# Patient Record
Sex: Female | Born: 1975 | Race: Black or African American | Hispanic: No | Marital: Married | Smoking: Never smoker
Health system: Southern US, Community
[De-identification: ages and names within clinical notes are randomized; demographics above are authoritative.]

## PROBLEM LIST (undated history)

## (undated) DIAGNOSIS — I10 Essential (primary) hypertension: Secondary | ICD-10-CM

## (undated) DIAGNOSIS — E119 Type 2 diabetes mellitus without complications: Secondary | ICD-10-CM

---

## 2018-06-01 ENCOUNTER — Other Ambulatory Visit: Payer: Self-pay | Admitting: Family Medicine

## 2018-06-01 DIAGNOSIS — Z1231 Encounter for screening mammogram for malignant neoplasm of breast: Secondary | ICD-10-CM

## 2018-06-23 ENCOUNTER — Ambulatory Visit
Admission: RE | Admit: 2018-06-23 | Discharge: 2018-06-23 | Disposition: A | Discharge status: Home / Self Care | Payer: Medicaid Other | Source: Ambulatory Visit | Attending: Family Medicine | Admitting: Family Medicine

## 2018-06-23 DIAGNOSIS — Z1231 Encounter for screening mammogram for malignant neoplasm of breast: Secondary | ICD-10-CM

## 2018-06-27 ENCOUNTER — Other Ambulatory Visit: Payer: Self-pay | Admitting: Family Medicine

## 2018-06-27 DIAGNOSIS — R928 Other abnormal and inconclusive findings on diagnostic imaging of breast: Secondary | ICD-10-CM

## 2018-07-01 ENCOUNTER — Ambulatory Visit
Admission: RE | Admit: 2018-07-01 | Discharge: 2018-07-01 | Disposition: A | Discharge status: Home / Self Care | Payer: Medicaid Other | Source: Ambulatory Visit | Attending: Family Medicine | Admitting: Family Medicine

## 2018-07-01 DIAGNOSIS — R928 Other abnormal and inconclusive findings on diagnostic imaging of breast: Secondary | ICD-10-CM

## 2019-09-23 ENCOUNTER — Other Ambulatory Visit: Payer: Self-pay

## 2019-09-23 DIAGNOSIS — Z20822 Contact with and (suspected) exposure to covid-19: Secondary | ICD-10-CM

## 2019-09-24 LAB — NOVEL CORONAVIRUS, NAA: SARS-CoV-2, NAA: NOT DETECTED

## 2020-02-27 ENCOUNTER — Emergency Department (HOSPITAL_COMMUNITY): Payer: Medicaid Other

## 2020-02-27 ENCOUNTER — Other Ambulatory Visit: Payer: Self-pay

## 2020-02-27 ENCOUNTER — Encounter (HOSPITAL_COMMUNITY): Payer: Self-pay | Admitting: Emergency Medicine

## 2020-02-27 ENCOUNTER — Emergency Department (HOSPITAL_COMMUNITY)
Admission: EM | Admit: 2020-02-27 | Discharge: 2020-02-27 | Disposition: A | Discharge status: Home / Self Care | Payer: Medicaid Other | Attending: Emergency Medicine | Admitting: Emergency Medicine

## 2020-02-27 DIAGNOSIS — R072 Precordial pain: Secondary | ICD-10-CM | POA: Diagnosis present

## 2020-02-27 DIAGNOSIS — E119 Type 2 diabetes mellitus without complications: Secondary | ICD-10-CM | POA: Insufficient documentation

## 2020-02-27 DIAGNOSIS — I10 Essential (primary) hypertension: Secondary | ICD-10-CM | POA: Insufficient documentation

## 2020-02-27 DIAGNOSIS — Z79899 Other long term (current) drug therapy: Secondary | ICD-10-CM | POA: Diagnosis not present

## 2020-02-27 DIAGNOSIS — R0789 Other chest pain: Secondary | ICD-10-CM | POA: Diagnosis not present

## 2020-02-27 HISTORY — DX: Essential (primary) hypertension: I10

## 2020-02-27 HISTORY — DX: Type 2 diabetes mellitus without complications: E11.9

## 2020-02-27 LAB — BASIC METABOLIC PANEL
Anion gap: 10 (ref 5–15)
BUN: 7 mg/dL (ref 6–20)
CO2: 22 mmol/L (ref 22–32)
Calcium: 9.1 mg/dL (ref 8.9–10.3)
Chloride: 104 mmol/L (ref 98–111)
Creatinine, Ser: 0.6 mg/dL (ref 0.44–1.00)
GFR calc Af Amer: >60 mL/min (ref >60)
GFR calc non Af Amer: >60 mL/min (ref >60)
Glucose, Bld: 218 mg/dL — ABNORMAL HIGH (ref 70–99)
Potassium: 4.1 mmol/L (ref 3.5–5.1)
Sodium: 136 mmol/L (ref 135–145)

## 2020-02-27 LAB — TROPONIN I (HIGH SENSITIVITY)
Troponin I (High Sensitivity): 24 ng/L — ABNORMAL HIGH (ref <18)
Troponin I (High Sensitivity): 22 ng/L — ABNORMAL HIGH (ref <18)

## 2020-02-27 LAB — PROTIME-INR
INR: 0.9 (ref 0.8–1.2)
Prothrombin Time: 12 seconds (ref 11.4–15.2)

## 2020-02-27 LAB — D-DIMER, QUANTITATIVE: D-Dimer, Quant: <0.27 ug/mL-FEU (ref 0.00–0.50)

## 2020-02-27 LAB — CBC
HCT: 36.5 % (ref 36.0–46.0)
Hemoglobin: 10.7 g/dL — ABNORMAL LOW (ref 12.0–15.0)
MCH: 22.3 pg — ABNORMAL LOW (ref 26.0–34.0)
MCHC: 29.3 g/dL — ABNORMAL LOW (ref 30.0–36.0)
MCV: 76.2 fL — ABNORMAL LOW (ref 80.0–100.0)
Platelets: 285 10*3/uL (ref 150–400)
RBC: 4.79 MIL/uL (ref 3.87–5.11)
RDW: 17.9 % — ABNORMAL HIGH (ref 11.5–15.5)
WBC: 6.1 10*3/uL (ref 4.0–10.5)
nRBC: 0 % (ref 0.0–0.2)

## 2020-02-27 LAB — I-STAT BETA HCG BLOOD, ED (MC, WL, AP ONLY): I-stat hCG, quantitative: <5 m[IU]/mL (ref <5)

## 2020-02-27 MED ORDER — OMEPRAZOLE 20 MG PO CPDR: 20.0000 mg | DELAYED_RELEASE_CAPSULE | Freq: Every day | ORAL | 14 refills | Status: AC

## 2020-02-27 MED ORDER — SODIUM CHLORIDE 0.9% FLUSH
3.0000 mL | Freq: Once | INTRAVENOUS | Status: AC
  Administered 2020-02-27: 3 mL via INTRAVENOUS

## 2020-02-27 MED ORDER — LIDOCAINE VISCOUS HCL 2 % MT SOLN
15.0000 mL | Freq: Once | OROMUCOSAL | Status: AC
  Administered 2020-02-27: 15 mL via ORAL
  Filled 2020-02-27: qty 15

## 2020-02-27 MED ORDER — ALUM & MAG HYDROXIDE-SIMETH 200-200-20 MG/5ML PO SUSP
30.0000 mL | Freq: Once | ORAL | Status: AC
  Administered 2020-02-27: 30 mL via ORAL
  Filled 2020-02-27: qty 30

## 2020-02-27 NOTE — ED Triage Notes (Signed)
Patient reports intermittent left upper chest pain onset this morning , no SOB , denies emesis or diaphoresis , no cough or fever .

## 2020-02-27 NOTE — ED Provider Notes (Signed)
[facility] EMERGENCY DEPARTMENT Provider Note   CSN: [id number] Arrival date & time: 02/27/20  0443     History Chief Complaint  Patient presents with  . Chest Pain    Connie Luna is a 44 y.o. female.  Patient is a 43 year old female with a history of hypertension, hyperlipidemia and diabetes who is presenting today with chest pain.  Patient reports last Wednesday she started noticing a discomfort in the center of her chest that she describes as pressure like something is stuck when she tries to swallow.  However it is unrelated to eating.  She has no difficulty swallowing her food and swallowing food does not make the discomfort worse.  It seems to come and go and is not related to exertion.  She did stop eating all spicy foods, drinking sodas and other harsh food and drink which she does not feel has made a difference.  She did use some Mylanta early on which seemed to help but now the symptoms seem to be coming more frequently.  This morning she woke up with a new sharp pinpoint type pain in the left side of her chest which is now resolved.  At no time has the pain radiated or has she felt short of breath, nauseated or diaphoretic.  She has noticed more frequent bowel movements this week but denies diarrhea.  She has not had any cough or congestion.  She did have Covid in January and recovered without sequela.  She has had no recent medication changes and denies any tobacco, alcohol or drug use.  No first-degree relatives with acute cardiac disease but maternal and paternal grandparents with heart disease.  No recent immobilization, unilateral leg pain or swelling or history of PE/DVT.  The history is provided by the patient.  Chest Pain Pain location:  Substernal area Pain quality: pressure   Pain radiates to:  Does not radiate Pain severity:  Mild Onset quality:  Gradual Duration:  7 days Timing:  Intermittent Progression:  Worsening Chronicity:  New Context  comment:  Seemed to start spontaneously Relieved by:  Antacids (initially improved with mylanta) Worsened by:  Nothing Ineffective treatments:  None tried Associated symptoms: no abdominal pain, no anorexia, no back pain, no cough, no diaphoresis, no fever, no lower extremity edema, no nausea, no near-syncope, no palpitations, no shortness of breath, no vomiting and no weakness   Risk factors: diabetes mellitus, high cholesterol, hypertension and obesity   Risk factors: no birth control, no immobilization, no prior DVT/PE, no smoking and no surgery        Past Medical History:  Diagnosis Date  . Diabetes mellitus without complication (HCC)   . Hypertension     There are no problems to display for this patient.   History reviewed. No pertinent surgical history.   OB History   No obstetric history on file.     Family History  Problem Relation Age of Onset  . Breast cancer Neg Hx     Social History   Tobacco Use  . Smoking status: Never Smoker  . Smokeless tobacco: Never Used  Substance Use Topics  . Alcohol use: Never  . Drug use: Never    Home Medications Prior to Admission medications   Not on File    Allergies    Patient has no known allergies.  Review of Systems   Review of Systems  Constitutional: Negative for diaphoresis and fever.  Respiratory: Negative for cough and shortness of breath.   Cardiovascular:  Positive for chest pain. Negative for palpitations and near-syncope.  Gastrointestinal: Negative for abdominal pain, anorexia, nausea and vomiting.  Musculoskeletal: Negative for back pain.  Neurological: Negative for weakness.  All other systems reviewed and are negative.   Physical Exam Updated Vital Signs BP 123/86   Pulse 85   Temp 98.2 F (36.8 C) (Oral)   Resp (!) 23   Ht 5\' 4"  (1.626 m)   Wt 120 kg   LMP 02/19/2020   SpO2 100%   BMI 45.41 kg/m   Physical Exam Vitals and nursing note reviewed.  Constitutional:      General: She  is not in acute distress.    Appearance: She is well-developed. She is obese.  HENT:     Head: Normocephalic and atraumatic.  Eyes:     Conjunctiva/sclera: Conjunctivae normal.     Pupils: Pupils are equal, round, and reactive to light.  Cardiovascular:     Rate and Rhythm: Normal rate and regular rhythm.     Heart sounds: Normal heart sounds. No murmur.  Pulmonary:     Effort: Pulmonary effort is normal. No respiratory distress.     Breath sounds: Normal breath sounds. No wheezing or rales.  Chest:     Chest wall: No tenderness.  Abdominal:     General: There is no distension.     Palpations: Abdomen is soft.     Tenderness: There is no abdominal tenderness. There is no guarding or rebound.  Musculoskeletal:        General: No tenderness. Normal range of motion.     Cervical back: Normal range of motion and neck supple.     Right lower leg: No tenderness. No edema.     Left lower leg: No tenderness. No edema.  Skin:    General: Skin is warm and dry.     Findings: No erythema or rash.  Neurological:     General: No focal deficit present.     Mental Status: She is alert and oriented to person, place, and time.  Psychiatric:        Mood and Affect: Mood normal.        Behavior: Behavior normal.     ED Results / Procedures / Treatments   Labs (all labs ordered are listed, but only abnormal results are displayed) Labs Reviewed  BASIC METABOLIC PANEL - Abnormal; Notable for the following components:      Result Value   Glucose, Bld 218 (*)    All other components within normal limits  CBC - Abnormal; Notable for the following components:   Hemoglobin 10.7 (*)    MCV 76.2 (*)    MCH 22.3 (*)    MCHC 29.3 (*)    RDW 17.9 (*)    All other components within normal limits  TROPONIN I (HIGH SENSITIVITY) - Abnormal; Notable for the following components:   Troponin I (High Sensitivity) 24 (*)    All other components within normal limits  TROPONIN I (HIGH SENSITIVITY) -  Abnormal; Notable for the following components:   Troponin I (High Sensitivity) 22 (*)    All other components within normal limits  PROTIME-INR  D-DIMER, QUANTITATIVE (NOT AT [facility])  I-STAT BETA HCG BLOOD, ED ([facility], [facility], [facility] ONLY)    EKG EKG Interpretation  Date/Time:  Tuesday February 27 2020 04:45:06 EDT Ventricular Rate:  108 PR Interval:  150 QRS Duration: 84 QT Interval:  328 QTC Calculation: 439 R Axis:   45 Text Interpretation: Sinus tachycardia Possible  Anterior infarct , age undetermined Abnormal ECG No old tracing to compare Confirmed by Merrily Pew [phone]) on 02/27/2020 5:08:01 AM   Radiology DG Chest 2 View  Result Date: 02/27/2020 CLINICAL DATA:  Chest pain EXAM: CHEST - 2 VIEW COMPARISON:  None. FINDINGS: Generous heart size. Normal aortic and hilar contours. There is no edema, consolidation, effusion, or pneumothorax. No acute osseous finding IMPRESSION: 1. Borderline heart size. 2. No pulmonary edema. Electronically Signed   By: Monte Fantasia M.D.   On: 02/27/2020 05:38    Procedures Procedures (including critical care time)  Medications Ordered in ED Medications  sodium chloride flush (NS) 0.9 % injection 3 mL (3 mLs Intravenous Given 02/27/20 0837)  alum & mag hydroxide-simeth (MAALOX/MYLANTA) 200-200-20 MG/5ML suspension 30 mL (30 mLs Oral Given 02/27/20 0837)    And  lidocaine (XYLOCAINE) 2 % viscous mouth solution 15 mL (15 mLs Oral Given 02/27/20 9935)    ED Course  I have reviewed the triage vital signs and the nursing notes.  Pertinent labs & imaging results that were available during my care of the patient were reviewed by me and considered in my medical decision making (see chart for details).    MDM Rules/Calculators/A&P                      44 year old female presenting today with atypical chest pain.  No infectious symptoms to suggest pneumonia, pericarditis or myocarditis.  Patient does not use excessive NSAIDs and does not consume significant  alcohol but does usually eat significant amounts of spicy foods which she has stopped but is still having symptoms however this could be GI related.  Initial improvement with Mylanta.  Patient also has multiple risk factors including hypertension, hyperlipidemia and diabetes this could be atypical ACS.  Patient's EKG showed sinus tachycardia but no other acute changes.  Troponin is mildly elevated at 24 and delta troponin is pending.  Low suspicion for dissection.  Given patient's tachycardia and atypical pain also PE is on the differential.  However she is a low risk Wells criteria and will do a D-dimer.  Patient's BMP and CBC without significant findings other than anemia which is most likely chronic from her heavy menses.  She has no history of lung disease and breath sounds are clear with low suspicion for pneumothorax, asthma exacerbation.  Heart pathway score of 2.  Pt given GI cocktail here.  10:57 AM D-dimer is within normal limits and delta troponin is 22 without significant change from initial troponin.  Given patient's risk factors and mildly elevated troponin which is flat spoke with cardiology who can get her in for close follow-up tomorrow at 3 PM.  Also will start omeprazole in case there is a GI component.  Findings discussed with patient and her husband and they are comfortable with this plan.  Patient was discharged home in good condition.  Final Clinical Impression(s) / ED Diagnoses Final diagnoses:  Atypical chest pain    Rx / DC Orders ED Discharge Orders         Ordered    omeprazole (PRILOSEC) 20 MG capsule  Daily     02/27/20 1052           Blanchie Dessert, MD 02/27/20 1058

## 2020-02-27 NOTE — Discharge Instructions (Addendum)
Start taking the omeprazole in case this is related to the stomach.  Continue to avoid caffeine, spicy foods and avoid ibuprofen products

## 2020-02-28 ENCOUNTER — Ambulatory Visit: Payer: Medicaid Other | Admitting: Cardiology

## 2020-02-28 ENCOUNTER — Encounter: Payer: Self-pay | Admitting: Cardiology

## 2020-02-28 VITALS — BP 130/90 | HR 116 | Ht 64.0 in | Wt 257.8 lb

## 2020-02-28 DIAGNOSIS — E785 Hyperlipidemia, unspecified: Secondary | ICD-10-CM

## 2020-02-28 DIAGNOSIS — R072 Precordial pain: Secondary | ICD-10-CM | POA: Diagnosis not present

## 2020-02-28 DIAGNOSIS — I10 Essential (primary) hypertension: Secondary | ICD-10-CM | POA: Diagnosis not present

## 2020-02-28 NOTE — Progress Notes (Signed)
Cardiology Office Note:    Date:  02/29/2020   ID:  Connie Luna, DOB 1976/01/03, MRN [id number]  PCP:  [name], MD  Cardiologist:  No primary care provider on file.  Electrophysiologist:  None   Referring MD: [name], MD   Chief Complaint  Patient presents with  . Chest Pain    History of Present Illness:    Connie Luna is a 44 y.o. female with a hx of hypertension, hyperlipidemia, diabetes who presents as a ED follow-up for chest pain.  Recent Covid infection in January.  Reports she woke up with chest pain around 3 AM on 3/30.  Described as sharp stabbing pain on left side of chest.  Lasted a few seconds and resolved, would occur repeatedly every few minutes for about 30 minutes.  She also describes a sensation that something is sitting in her chest, describes as feeling like food being stuck, it has been continuous for 1 week.  Denies any shortness of breath.  She typically walks for 20 minutes 2-3 times per week, denies any exertional chest pain or dyspnea.  She is on losartan for hypertension, but only takes about once per week.  No smoking history.  No history of heart disease in her immediate family.  Work-up in the ED included D-dimer negative, troponin 24 -> 22.    Past Medical History:  Diagnosis Date  . Diabetes mellitus without complication (HCC)   . Hypertension     No past surgical history on file.  Current Medications: Current Meds  Medication Sig  . atorvastatin (LIPITOR) 40 MG tablet Take 40 mg by mouth at bedtime.  . fluticasone (FLONASE) 50 MCG/ACT nasal spray Place 2 sprays into both nostrils in the morning and at bedtime.  . insulin glargine (LANTUS) 100 UNIT/ML Solostar Pen Inject 60 Units into the skin at bedtime.  Marland Kitchen loratadine (CLARITIN) 10 MG tablet Take 10 mg by mouth daily as needed for allergies.  Marland Kitchen losartan (COZAAR) 25 MG tablet Take 25 mg by mouth daily.  Marland Kitchen omeprazole (PRILOSEC) 20 MG capsule Take 1 capsule (20 mg total) by mouth  daily.  Marland Kitchen tetrahydrozoline-zinc (VISINE-AC) 0.05-0.25 % ophthalmic solution Place 2 drops into both eyes 2 (two) times daily as needed (Dry Eyes).     Allergies:   Patient has no known allergies.   Social History   Socioeconomic History  . Marital status: Married    Spouse name: Not on file  . Number of children: Not on file  . Years of education: Not on file  . Highest education level: Not on file  Occupational History  . Not on file  Tobacco Use  . Smoking status: Never Smoker  . Smokeless tobacco: Never Used  Substance and Sexual Activity  . Alcohol use: Never  . Drug use: Never  . Sexual activity: Never  Other Topics Concern  . Not on file  Social History Narrative  . Not on file   Social Determinants of Health   Financial Resource Strain:   . Difficulty of Paying Living Expenses:   Food Insecurity:   . Worried About Programme researcher, broadcasting/film/video in the Last Year:   . Barista in the Last Year:   Transportation Needs:   . Freight forwarder (Medical):   Marland Kitchen Lack of Transportation (Non-Medical):   Physical Activity:   . Days of Exercise per Week:   . Minutes of Exercise per Session:   Stress:   . Feeling of Stress :  Social Connections:   . Frequency of Communication with Friends and Family:   . Frequency of Social Gatherings with Friends and Family:   . Attends Religious Services:   . Active Member of Clubs or Organizations:   . Attends Banker Meetings:   Marland Kitchen Marital Status:      Family History: The patient's family history is negative for Breast cancer.  ROS:   Please see the history of present illness.     All other systems reviewed and are negative.  EKGs/Labs/Other Studies Reviewed:    The following studies were reviewed today:   EKG:  EKG is ordered today.  The ekg ordered demonstrates normal sinus rhythm, rate 99, no ST/T abnormalities  Recent Labs: 02/27/2020: BUN 7; Creatinine, Ser 0.60; Hemoglobin 10.7; Platelets 285;  Potassium 4.1; Sodium 136  Recent Lipid Panel No results found for: CHOL, TRIG, HDL, CHOLHDL, VLDL, LDLCALC, LDLDIRECT  Physical Exam:    VS:  BP 130/90   Pulse (!) 116   Ht 5\' 4"  (1.626 m)   Wt 257 lb 12.8 oz (116.9 kg)   LMP 02/19/2020   SpO2 98%   BMI 44.25 kg/m     Wt Readings from Last 3 Encounters:  02/28/20 257 lb 12.8 oz (116.9 kg)  02/27/20 264 lb 8.8 oz (120 kg)     GEN:  in no acute distress HEENT: Normal NECK: No JVD LYMPHATICS: No lymphadenopathy CARDIAC:RRR, no murmurs, rubs, gallops RESPIRATORY:  Clear to auscultation without rales, wheezing or rhonchi  ABDOMEN: Soft, non-tender, non-distended MUSCULOSKELETAL:  No edema; No deformity  SKIN: Warm and dry NEUROLOGIC:  Alert and oriented x 3 PSYCHIATRIC:  Normal affect   ASSESSMENT:    1. Precordial pain   2. Essential hypertension   3. Hyperlipidemia, unspecified hyperlipidemia type    PLAN:     Chest pain: Atypical in description, but does have significant CAD risk factors (diabetes, hypertension, hyperlipidemia).  Given tachycardia and morbid obesity, not a good candidate for coronary CTA.  Will evaluate for ischemia with Lexiscan Myoview  Hypertension: On losartan 25 mg daily.  BP is elevated but did not take today.  Reports only taking losartan about once per week.  Encourage compliance with taking losartan daily  Type 2 diabetes: On Lantus  Hyperlipidemia: Continue atorvastatin 40 mg daily  RTC in 6 weeks  Medication Adjustments/Labs and Tests Ordered: Current medicines are reviewed at length with the patient today.  Concerns regarding medicines are outlined above.  Orders Placed This Encounter  Procedures  . MYOCARDIAL PERFUSION IMAGING  . EKG 12-Lead   No orders of the defined types were placed in this encounter.   Patient Instructions  Medication Instructions:  CONTINUE WITH CURRENT MEDICATIONS. NO CHANGES.  *If you need a refill on your cardiac medications before your next  appointment, please call your pharmacy   Testing/Procedures: Your physician has requested that you have a lexiscan myoview. For further information please visit 02/29/20. Please follow instruction sheet, as given. [address]   Follow-Up: At [facility], you and your health needs are our priority.  As part of our continuing mission to provide you with exceptional heart care, we have created designated Provider Care Teams.  These Care Teams include your primary Cardiologist (physician) and Advanced Practice Providers (APPs -  Physician Assistants and Nurse Practitioners) who all work together to provide you with the care you need, when you need it.  We recommend signing up for the patient portal called "MyChart".  Sign  up information is provided on this After Visit Summary.  MyChart is used to connect with patients for Virtual Visits (Telemedicine).  Patients are able to view lab/test results, encounter notes, upcoming appointments, etc.  Non-urgent messages can be sent to your provider as well.   To learn more about what you can do with MyChart, go to [email or web address].    Your next appointment:   6 week(s)  The format for your next appointment:   In Person  Provider:   Oswaldo Milian, MD        Signed, Donato Heinz, MD  02/29/2020 6:30 PM    [place]

## 2020-02-28 NOTE — Patient Instructions (Signed)
Medication Instructions:  CONTINUE WITH CURRENT MEDICATIONS. NO CHANGES.  *If you need a refill on your cardiac medications before your next appointment, please call your pharmacy   Testing/Procedures: Your physician has requested that you have a lexiscan myoview. For further information please visit [email or web address] Please follow instruction sheet, as given. [address]   Follow-Up: At [facility], you and your health needs are our priority.  As part of our continuing mission to provide you with exceptional heart care, we have created designated Provider Care Teams.  These Care Teams include your primary Cardiologist (physician) and Advanced Practice Providers (APPs -  Physician Assistants and Nurse Practitioners) who all work together to provide you with the care you need, when you need it.  We recommend signing up for the patient portal called "MyChart".  Sign up information is provided on this After Visit Summary.  MyChart is used to connect with patients for Virtual Visits (Telemedicine).  Patients are able to view lab/test results, encounter notes, upcoming appointments, etc.  Non-urgent messages can be sent to your provider as well.   To learn more about what you can do with MyChart, go to [email or web address].    Your next appointment:   6 week(s)  The format for your next appointment:   In Person  Provider:   Epifanio Lesches, MD

## 2020-03-02 ENCOUNTER — Ambulatory Visit: Payer: Medicaid Other | Attending: Internal Medicine

## 2020-03-02 DIAGNOSIS — Z23 Encounter for immunization: Secondary | ICD-10-CM

## 2020-03-02 NOTE — Progress Notes (Signed)
   Covid-19 Vaccination Clinic  Name:  Connie Luna    MRN: [id number] DOB: 07-16-1976  03/02/2020  [name] was observed post Covid-19 immunization for 15 minutes without incident. She was provided with Vaccine Information Sheet and instruction to access the V-Safe system.   [name] was instructed to call 911 with any severe reactions post vaccine: Marland Kitchen Difficulty breathing  . Swelling of face and throat  . A fast heartbeat  . A bad rash all over body  . Dizziness and weakness   Immunizations Administered    Name Date Dose VIS Date Route   Moderna COVID-19 Vaccine 03/02/2020  2:07 PM 0.5 mL 10/31/2019 Intramuscular   Manufacturer: Moderna   Lot: [id number]   NDC: 50539-767-34

## 2020-03-13 ENCOUNTER — Telehealth (HOSPITAL_COMMUNITY): Payer: Self-pay | Admitting: *Deleted

## 2020-03-13 NOTE — Telephone Encounter (Signed)
Patient given detailed instructions per Myocardial Perfusion Study Information Sheet for the test on 03/18/20 Patient notified to arrive 15 minutes early and that it is imperative to arrive on time for appointment to keep from having the test rescheduled.  If you need to cancel or reschedule your appointment, please call the office within 24 hours of your appointment. . Patient verbalized understanding. Cosmo Tetreault Jacqueline    

## 2020-03-18 ENCOUNTER — Telehealth (HOSPITAL_COMMUNITY): Payer: Self-pay

## 2020-03-18 ENCOUNTER — Other Ambulatory Visit: Payer: Self-pay

## 2020-03-18 ENCOUNTER — Ambulatory Visit (HOSPITAL_COMMUNITY): Payer: Medicaid Other | Attending: Internal Medicine

## 2020-03-18 VITALS — Ht 64.0 in | Wt 257.0 lb

## 2020-03-18 DIAGNOSIS — R110 Nausea: Secondary | ICD-10-CM | POA: Diagnosis present

## 2020-03-18 DIAGNOSIS — R072 Precordial pain: Secondary | ICD-10-CM | POA: Diagnosis not present

## 2020-03-18 MED ORDER — TECHNETIUM TC 99M TETROFOSMIN IV KIT
33.0000 | PACK | Freq: Once | INTRAVENOUS | Status: AC | PRN
  Administered 2020-03-18: 33 via INTRAVENOUS
  Filled 2020-03-18: qty 33

## 2020-03-18 MED ORDER — REGADENOSON 0.4 MG/5ML IV SOLN
0.4000 mg | Freq: Once | INTRAVENOUS | Status: AC
  Administered 2020-03-18: 0.4 mg via INTRAVENOUS

## 2020-03-18 MED ORDER — AMINOPHYLLINE 25 MG/ML IV SOLN
75.0000 mg | Freq: Once | INTRAVENOUS | Status: AC
  Administered 2020-03-18: 75 mg via INTRAVENOUS

## 2020-03-19 ENCOUNTER — Ambulatory Visit (HOSPITAL_COMMUNITY): Payer: Medicaid Other | Attending: Cardiology

## 2020-03-19 LAB — MYOCARDIAL PERFUSION IMAGING
LV dias vol: 170 mL (ref 46–106)
LV sys vol: 117 mL
Peak HR: 117 {beats}/min
Rest HR: 95 {beats}/min
SDS: 0
SRS: 2
SSS: 2
TID: 1.09

## 2020-03-19 MED ORDER — TECHNETIUM TC 99M TETROFOSMIN IV KIT
29.6000 | PACK | Freq: Once | INTRAVENOUS | Status: AC | PRN
  Administered 2020-03-19: 29.6 via INTRAVENOUS
  Filled 2020-03-19: qty 30

## 2020-03-28 ENCOUNTER — Telehealth: Payer: Self-pay | Admitting: Cardiology

## 2020-03-28 DIAGNOSIS — I10 Essential (primary) hypertension: Secondary | ICD-10-CM

## 2020-03-28 DIAGNOSIS — R072 Precordial pain: Secondary | ICD-10-CM

## 2020-03-28 NOTE — Telephone Encounter (Signed)
Patient aware of results and recommendations.   Order placed for echo, message sent to scheduler to call to try to arrange prior to OV on 5/12

## 2020-03-28 NOTE — Telephone Encounter (Signed)
Follow Up:     Pt calling you back, concerning her results

## 2020-03-30 ENCOUNTER — Ambulatory Visit: Payer: Medicaid Other | Attending: Critical Care Medicine

## 2020-03-30 DIAGNOSIS — Z23 Encounter for immunization: Secondary | ICD-10-CM

## 2020-03-30 NOTE — Progress Notes (Signed)
   Covid-19 Vaccination Clinic  Name:  Connie Luna    MRN: [id number] DOB: 13-Feb-1976  03/30/2020  [name] was observed post Covid-19 immunization for 15 minutes without incident. She was provided with Vaccine Information Sheet and instruction to access the V-Safe system.   [name] was instructed to call 911 with any severe reactions post vaccine: Marland Kitchen Difficulty breathing  . Swelling of face and throat  . A fast heartbeat  . A bad rash all over body  . Dizziness and weakness   Immunizations Administered    Name Date Dose VIS Date Route   Moderna COVID-19 Vaccine 03/30/2020 12:02 PM 0.5 mL 10/2019 Intramuscular   Manufacturer: Moderna   Lot: [id number]   NDC: 42395-320-23

## 2020-04-02 ENCOUNTER — Other Ambulatory Visit: Payer: Self-pay

## 2020-04-02 ENCOUNTER — Ambulatory Visit (HOSPITAL_COMMUNITY)
Admission: RE | Admit: 2020-04-02 | Discharge: 2020-04-02 | Disposition: A | Discharge status: Home / Self Care | Payer: Medicaid Other | Source: Ambulatory Visit | Attending: Cardiology | Admitting: Cardiology

## 2020-04-02 DIAGNOSIS — I10 Essential (primary) hypertension: Secondary | ICD-10-CM | POA: Diagnosis not present

## 2020-04-02 DIAGNOSIS — E119 Type 2 diabetes mellitus without complications: Secondary | ICD-10-CM | POA: Insufficient documentation

## 2020-04-02 DIAGNOSIS — R072 Precordial pain: Secondary | ICD-10-CM | POA: Diagnosis not present

## 2020-04-02 NOTE — Progress Notes (Signed)
  Echocardiogram 2D Echocardiogram has been performed.  [name] 04/02/2020, 8:51 AM

## 2020-04-07 NOTE — Progress Notes (Deleted)
Cardiology Office Note:    Date:  04/07/2020   ID:  Connie Luna, DOB [birth year], MRN [id number]  PCP:  [name], MD  Cardiologist:  No primary care provider on file.  Electrophysiologist:  None   Referring MD: [name], MD   No chief complaint on file.   History of Present Illness:    Connie Luna is a 44 y.o. female with a hx of hypertension, hyperlipidemia, diabetes who presents for follow-up.  She was initially seen as a ED follow-up for chest pain on 02/28/2020.  Recent Covid infection in January.  Reports she woke up with chest pain around 3 AM on 3/30.  Described as sharp stabbing pain on left side of chest.  Lasted a few seconds and resolved, would occur repeatedly every few minutes for about 30 minutes.  She also describes a sensation that something is sitting in her chest, describes as feeling like food being stuck, it has been continuous for 1 week.  Denies any shortness of breath.  She typically walks for 20 minutes 2-3 times per week, denies any exertional chest pain or dyspnea.  She is on losartan for hypertension, but only takes about once per week.  No smoking history.  No history of heart disease in her immediate family.  Work-up in the ED included D-dimer negative, troponin 24 -> 22.  Lexiscan Myoview on 03/19/2020 showed small fixed defect at the apex, no evidence of ischemia.  EF 31%.  TTE on 04/02/2020 showed basal inferior/inferoseptal akinesis with LVEF 35 to 40%, grade 2 diastolic dysfunction, moderate LV dilatation, no significant valvular disease, IVC small/collapsible.    Past Medical History:  Diagnosis Date  . Diabetes mellitus without complication (HCC)   . Hypertension     No past surgical history on file.  Current Medications: No outpatient medications have been marked as taking for the 04/10/20 encounter (Appointment) with [name], MD.     Allergies:   Patient has no known allergies.   Social History   Socioeconomic  History  . Marital status: Married    Spouse name: Not on file  . Number of children: Not on file  . Years of education: Not on file  . Highest education level: Not on file  Occupational History  . Not on file  Tobacco Use  . Smoking status: Never Smoker  . Smokeless tobacco: Never Used  Substance and Sexual Activity  . Alcohol use: Never  . Drug use: Never  . Sexual activity: Never  Other Topics Concern  . Not on file  Social History Narrative  . Not on file   Social Determinants of Health   Financial Resource Strain:   . Difficulty of Paying Living Expenses:   Food Insecurity:   . Worried About Programme researcher, broadcasting/film/video in the Last Year:   . Barista in the Last Year:   Transportation Needs:   . Freight forwarder (Medical):   Marland Kitchen Lack of Transportation (Non-Medical):   Physical Activity:   . Days of Exercise per Week:   . Minutes of Exercise per Session:   Stress:   . Feeling of Stress :   Social Connections:   . Frequency of Communication with Friends and Family:   . Frequency of Social Gatherings with Friends and Family:   . Attends Religious Services:   . Active Member of Clubs or Organizations:   . Attends Banker Meetings:   Marland Kitchen Marital Status:      Family  History: The patient's family history is negative for Breast cancer.  ROS:   Please see the history of present illness.     All other systems reviewed and are negative.  EKGs/Labs/Other Studies Reviewed:    The following studies were reviewed today:   EKG:  EKG is ordered today.  The ekg ordered demonstrates normal sinus rhythm, rate 99, no ST/T abnormalities  Recent Labs: 02/27/2020: BUN 7; Creatinine, Ser 0.60; Hemoglobin 10.7; Platelets 285; Potassium 4.1; Sodium 136  Recent Lipid Panel No results found for: CHOL, TRIG, HDL, CHOLHDL, VLDL, LDLCALC, LDLDIRECT  Physical Exam:    VS:  There were no vitals taken for this visit.    Wt Readings from Last 3 Encounters:  03/18/20  257 lb (116.6 kg)  02/28/20 257 lb 12.8 oz (116.9 kg)  02/27/20 264 lb 8.8 oz (120 kg)     GEN:  in no acute distress HEENT: Normal NECK: No JVD LYMPHATICS: No lymphadenopathy CARDIAC:RRR, no murmurs, rubs, gallops RESPIRATORY:  Clear to auscultation without rales, wheezing or rhonchi  ABDOMEN: Soft, non-tender, non-distended MUSCULOSKELETAL:  No edema; No deformity  SKIN: Warm and dry NEUROLOGIC:  Alert and oriented x 3 PSYCHIATRIC:  Normal affect   ASSESSMENT:    No diagnosis found. PLAN:     Acute combined systolic and diastolic heart failure:  Lexiscan Myoview on 03/19/2020 showed small fixed defect at the apex, no evidence of ischemia.  EF 31%.  TTE on 04/02/2020 showed basal inferior/inferoseptal akinesis with LVEF 35 to 79%, grade 2 diastolic dysfunction, moderate LV dilatation, no significant valvular disease, IVC small/collapsible. -Continue losartan 25 mg daily.  Will transition to [facility] -Plan to add beta-blocker and Aldactone as tolerated -CMR for further evaluation  Chest pain: Atypical in description.  Lexiscan Myoview showed fixed apical defect, no ischemia.  Hypertension: On losartan 25 mg daily  Type 2 diabetes: On Lantus  Hyperlipidemia: Continue atorvastatin 40 mg daily  RTC in 6 weeks  Medication Adjustments/Labs and Tests Ordered: Current medicines are reviewed at length with the patient today.  Concerns regarding medicines are outlined above.  No orders of the defined types were placed in this encounter.  No orders of the defined types were placed in this encounter.   There are no Patient Instructions on file for this visit.   Signed, Donato Heinz, MD  04/07/2020 2:06 PM    [facility]

## 2020-04-10 ENCOUNTER — Ambulatory Visit: Payer: Medicaid Other | Admitting: Cardiology

## 2020-04-19 ENCOUNTER — Ambulatory Visit (INDEPENDENT_AMBULATORY_CARE_PROVIDER_SITE_OTHER): Payer: Medicaid Other | Admitting: Cardiology

## 2020-04-19 ENCOUNTER — Encounter: Payer: Self-pay | Admitting: Cardiology

## 2020-04-19 ENCOUNTER — Other Ambulatory Visit: Payer: Self-pay

## 2020-04-19 VITALS — BP 128/80 | HR 94 | Temp 97.0°F | Ht 64.0 in | Wt 212.0 lb

## 2020-04-19 DIAGNOSIS — Z13220 Encounter for screening for lipoid disorders: Secondary | ICD-10-CM

## 2020-04-19 DIAGNOSIS — Z01812 Encounter for preprocedural laboratory examination: Secondary | ICD-10-CM | POA: Diagnosis not present

## 2020-04-19 DIAGNOSIS — I10 Essential (primary) hypertension: Secondary | ICD-10-CM

## 2020-04-19 DIAGNOSIS — I5041 Acute combined systolic (congestive) and diastolic (congestive) heart failure: Secondary | ICD-10-CM

## 2020-04-19 DIAGNOSIS — E119 Type 2 diabetes mellitus without complications: Secondary | ICD-10-CM | POA: Diagnosis not present

## 2020-04-19 DIAGNOSIS — Z794 Long term (current) use of insulin: Secondary | ICD-10-CM

## 2020-04-19 MED ORDER — METOPROLOL SUCCINATE ER 25 MG PO TB24
25.0000 mg | ORAL_TABLET | Freq: Every day | ORAL | 3 refills | Status: DC
Start: 2020-04-19 — End: 2020-04-24

## 2020-04-19 NOTE — Patient Instructions (Addendum)
    [facility] CARDIOVASCULAR DIVISION [facility] [address] [place] [address] Dept: [phone] Loc: [phone]  Connie Luna  04/19/2020  You are scheduled for a Cardiac Catheterization on Wednesday, May 26 with Dr. Bryan Lemma.  1. Please arrive at the [facility] (Main Entrance A) at [facility]: [address] [place], [place] [address] at 9:30 AM (This time is two hours before your procedure to ensure your preparation). Free valet parking service is available.   Special note: Every effort is made to have your procedure done on time. Please understand that emergencies sometimes delay scheduled procedures.  2. Diet: Do not eat solid foods after midnight.  The patient may have clear liquids until 5am upon the day of the procedure.  3. Labs: today in office  COVID TEST: Monday 5/24 at 1:45 pm [address] [employer]  4. Medication instructions in preparation for your procedure:   Contrast Allergy: No  Hold Losartan AM of procedure  Take 1/2 of your usual dose of insulin the night before procedure, NO insulin AM of procedure   On the morning of your procedure, take your Aspirin and any morning medicines NOT listed above.  You may use sips of water.  5. Plan for one night stay--bring personal belongings. 6. Bring a current list of your medications and current insurance cards. 7. You MUST have a responsible person to drive you home. 8. Someone MUST be with you the first 24 hours after you arrive home or your discharge will be delayed. 9. Please wear clothes that are easy to get on and off and wear slip-on shoes.  Thank you for allowing [place] to care for you!   -- [facility] [place] [facility]    START metoprolol succinate (Toprol XL) 25 mg daily   Follow up with Dr. Bjorn Pippin in 2 weeks

## 2020-04-19 NOTE — Progress Notes (Signed)
Cardiology Office Note:    Date:  04/21/2020   ID:  Connie Luna, DOB February 13, 1976, MRN [id number]  PCP:  [name], MD  Cardiologist:  No primary care provider on file.  Electrophysiologist:  None   Referring MD: [name], MD   Chief Complaint  Patient presents with  . Chest Pain    History of Present Illness:    Connie Luna is a 44 y.o. female with a hx of hypertension, hyperlipidemia, diabetes who presents for follow-up.  She was initially seen as a ED follow-up for chest pain on 02/28/2020.  Recent Covid infection in January.  Reports she woke up with chest pain around 3 AM on 3/30.  Described as sharp stabbing pain on left side of chest.  Lasted a few seconds and resolved, would occur repeatedly every few minutes for about 30 minutes.  She also describes a sensation that something is sitting in her chest, describes as feeling like food being stuck, it has been continuous for 1 week.  Denies any shortness of breath.  She typically walks for 20 minutes 2-3 times per week, denies any exertional chest pain or dyspnea.  She has been on losartan for hypertension, but was only taking about once per week.  No smoking history.  No history of heart disease in her immediate family.  Work-up in the ED included D-dimer negative, troponin 24 -> 22.  Lexiscan Myoview on 03/19/2020 showed small fixed defect at the apex, no evidence of ischemia (though no poor quality study with low cardiac counts compared to extracardiac activity)..  EF 31%.  TTE on 04/02/2020 showed basal inferior/inferoseptal akinesis with LVEF 35 to 40%, grade 2 diastolic dysfunction, moderate LV dilatation, no significant valvular disease, IVC small/collapsible.  Since last clinic visit, she reports that she continues to have chest pain, occurring a couple times per week.  Typically last 30 to 45 minutes.  States that it feels like something is stuck in her chest.  She has been walking 20 minutes every 1 to 2 days.  Has not  noted relationship with exertion.  She denies any shortness of breath, lightheadedness, or syncope.  Does report having palpitations occurring about once every 2 weeks that lasts for less than a minute.  Feels like heart is racing.   Past Medical History:  Diagnosis Date  . Diabetes mellitus without complication (HCC)   . Hypertension     No past surgical history on file.  Current Medications: Current Meds  Medication Sig  . atorvastatin (LIPITOR) 40 MG tablet Take 40 mg by mouth at bedtime.  . fluticasone (FLONASE) 50 MCG/ACT nasal spray Place 2 sprays into both nostrils in the morning and at bedtime.  . insulin glargine (LANTUS) 100 UNIT/ML Solostar Pen Inject 60 Units into the skin at bedtime.  Marland Kitchen loratadine (CLARITIN) 10 MG tablet Take 10 mg by mouth daily as needed for allergies.  Marland Kitchen losartan (COZAAR) 25 MG tablet Take 25 mg by mouth daily.  Marland Kitchen omeprazole (PRILOSEC) 20 MG capsule Take 1 capsule (20 mg total) by mouth daily.  Marland Kitchen tetrahydrozoline-zinc (VISINE-AC) 0.05-0.25 % ophthalmic solution Place 2 drops into both eyes 2 (two) times daily as needed (Dry Eyes).     Allergies:   Patient has no known allergies.   Social History   Socioeconomic History  . Marital status: Married    Spouse name: Not on file  . Number of children: Not on file  . Years of education: Not on file  . Highest education level:  Not on file  Occupational History  . Not on file  Tobacco Use  . Smoking status: Never Smoker  . Smokeless tobacco: Never Used  Substance and Sexual Activity  . Alcohol use: Never  . Drug use: Never  . Sexual activity: Never  Other Topics Concern  . Not on file  Social History Narrative  . Not on file   Social Determinants of Health   Financial Resource Strain:   . Difficulty of Paying Living Expenses:   Food Insecurity:   . Worried About Running Out of Food in the Last Year:   . Ran Out of Food in the Last Year:   Transportation Needs:   . Lack of Transportation  (Medical):   . Lack of Transportation (Non-Medical):   Physical Activity:   . Days of Exercise per Week:   . Minutes of Exercise per Session:   Stress:   . Feeling of Stress :   Social Connections:   . Frequency of Communication with Friends and Family:   . Frequency of Social Gatherings with Friends and Family:   . Attends Religious Services:   . Active Member of Clubs or Organizations:   . Attends Club or Organization Meetings:   . Marital Status:      Family History: The patient's family history is negative for Breast cancer.  ROS:   Please see the history of present illness.     All other systems reviewed and are negative.  EKGs/Labs/Other Studies Reviewed:    The following studies were reviewed today:   EKG:  EKG is ordered today.  The ekg ordered demonstrates normal sinus rhythm, rate 94, poor R wave progression, no ST abnormalities  Recent Labs: 04/19/2020: BUN 8; Creatinine, Ser 0.75; Hemoglobin 10.4; Platelets 245; Potassium 4.2; Sodium 136; TSH 1.790  Recent Lipid Panel    Component Value Date/Time   CHOL 164 04/19/2020 1049   TRIG 76 04/19/2020 1049   HDL 59 04/19/2020 1049   CHOLHDL 2.8 04/19/2020 1049   LDLCALC 91 04/19/2020 1049    Physical Exam:    VS:  BP 128/80   Pulse 94   Temp (!) 97 F (36.1 C)   Ht 5' 4" (1.626 m)   Wt 212 lb (96.2 kg)   SpO2 97%   BMI 36.39 kg/m     Wt Readings from Last 3 Encounters:  04/19/20 212 lb (96.2 kg)  03/18/20 257 lb (116.6 kg)  02/28/20 257 lb 12.8 oz (116.9 kg)     GEN:  in no acute distress HEENT: Normal NECK: No JVD LYMPHATICS: No lymphadenopathy CARDIAC:RRR, no murmurs, rubs, gallops RESPIRATORY:  Clear to auscultation without rales, wheezing or rhonchi  ABDOMEN: Soft, non-tender, non-distended MUSCULOSKELETAL:  No edema; No deformity  SKIN: Warm and dry NEUROLOGIC:  Alert and oriented x 3 PSYCHIATRIC:  Normal affect   ASSESSMENT:    1. Acute combined systolic and diastolic heart failure  (HCC)   2. Pre-procedure lab exam   3. Type 2 diabetes mellitus without complication, with long-term current use of insulin (HCC)   4. Lipid screening   5. Essential hypertension    PLAN:     Acute combined systolic and diastolic heart failure:  Lexiscan Myoview on 03/19/2020 showed small fixed defect at the apex, no evidence of ischemia but was poor quality study with low cardiac counts compared to extracardiac activity.  EF 31%.  TTE on 04/02/2020 showed basal inferior/inferoseptal akinesis with LVEF 35 to 40%, grade 2 diastolic dysfunction, moderate LV dilatation,   no significant valvular disease, IVC small/collapsible -Continue losartan 25 mg daily -Will start toprol XL 25 mg daily -Given poor quality Lexiscan Myoview and considering patient continues to have atypical chest pain with significant CAD risk factors (type 2 diabetes, hypertension, hyperlipidemia) and segmental wall motion abnormality on echo, would favor definitive evaluation for CAD with catheterization.  Will order LHC/RHC. Risks and benefits of cardiac catheterization have been discussed with the patient.  These include bleeding, infection, kidney damage, stroke, heart attack, death.  The patient understands these risks and is willing to proceed. -Volume status difficult to assess, but does not appear hypervolemic and IVC was small/collapsible on recent TTE.  Will hold off on starting Lasix at this time and follow-up results of RHC  Hypertension: On losartan 25 mg daily.  Add toprol XL 25 mg daily as above  Type 2 diabetes: On Lantus.  Will check A1c.    Hyperlipidemia: Continue atorvastatin 40 mg daily.  Will check lipid panel.  Anemia: Hemoglobin 10.7 on 02/27/2020.  Will check CBC, iron studies  RTC in 2 weeks  Medication Adjustments/Labs and Tests Ordered: Current medicines are reviewed at length with the patient today.  Concerns regarding medicines are outlined above.  Orders Placed This Encounter  Procedures  .  Fe+TIBC+Fer  . Basic metabolic panel  . CBC  . Lipid panel  . TSH  . Hemoglobin A1c  . EKG 12-Lead   Meds ordered this encounter  Medications  . metoprolol succinate (TOPROL XL) 25 MG 24 hr tablet    Sig: Take 1 tablet (25 mg total) by mouth daily.    Dispense:  90 tablet    Refill:  3    Patient Instructions     [facility] CARDIOVASCULAR DIVISION [facility] [address] [place] [address] Dept: [phone] Loc: [phone]  Connie Luna  04/19/2020  You are scheduled for a Cardiac Catheterization on Wednesday, May 26 with Dr. Bryan Lemma.  1. Please arrive at the [facility] (Main Entrance A) at [facility]: [address] [place], [place] [address] at 9:30 AM (This time is two hours before your procedure to ensure your preparation). Free valet parking service is available.   Special note: Every effort is made to have your procedure done on time. Please understand that emergencies sometimes delay scheduled procedures.  2. Diet: Do not eat solid foods after midnight.  The patient may have clear liquids until 5am upon the day of the procedure.  3. Labs: today in office  COVID TEST: Monday 5/24 at 1:45 pm [address] [employer]  4. Medication instructions in preparation for your procedure:   Contrast Allergy: No  Hold Losartan AM of procedure  Take 1/2 of your usual dose of insulin the night before procedure, NO insulin AM of procedure   On the morning of your procedure, take your Aspirin and any morning medicines NOT listed above.  You may use sips of water.  5. Plan for one night stay--bring personal belongings. 6. Bring a current list of your medications and current insurance cards. 7. You MUST have a responsible person to drive you home. 8. Someone MUST be with you the first 24 hours after you arrive home or your discharge will be delayed. 9. Please wear clothes that are easy to get on and  off and wear slip-on shoes.  Thank you for allowing [place] to care for you!   -- [place] [facility]  START metoprolol succinate (Toprol XL) 25 mg daily   Follow up with Dr. Gardiner Rhyme in 2 weeks    Signed, Donato Heinz, MD  04/21/2020 4:16 PM    [place]

## 2020-04-19 NOTE — H&P (View-Only) (Signed)
Cardiology Office Note:    Date:  04/21/2020   ID:  Connie Luna, DOB February 13, 1976, MRN [id number]  PCP:  [name], MD  Cardiologist:  No primary care provider on file.  Electrophysiologist:  None   Referring MD: [name], MD   Chief Complaint  Patient presents with  . Chest Pain    History of Present Illness:    Connie Luna is a 44 y.o. female with a hx of hypertension, hyperlipidemia, diabetes who presents for follow-up.  She was initially seen as a ED follow-up for chest pain on 02/28/2020.  Recent Covid infection in January.  Reports she woke up with chest pain around 3 AM on 3/30.  Described as sharp stabbing pain on left side of chest.  Lasted a few seconds and resolved, would occur repeatedly every few minutes for about 30 minutes.  She also describes a sensation that something is sitting in her chest, describes as feeling like food being stuck, it has been continuous for 1 week.  Denies any shortness of breath.  She typically walks for 20 minutes 2-3 times per week, denies any exertional chest pain or dyspnea.  She has been on losartan for hypertension, but was only taking about once per week.  No smoking history.  No history of heart disease in her immediate family.  Work-up in the ED included D-dimer negative, troponin 24 -> 22.  Lexiscan Myoview on 03/19/2020 showed small fixed defect at the apex, no evidence of ischemia (though no poor quality study with low cardiac counts compared to extracardiac activity)..  EF 31%.  TTE on 04/02/2020 showed basal inferior/inferoseptal akinesis with LVEF 35 to 40%, grade 2 diastolic dysfunction, moderate LV dilatation, no significant valvular disease, IVC small/collapsible.  Since last clinic visit, she reports that she continues to have chest pain, occurring a couple times per week.  Typically last 30 to 45 minutes.  States that it feels like something is stuck in her chest.  She has been walking 20 minutes every 1 to 2 days.  Has not  noted relationship with exertion.  She denies any shortness of breath, lightheadedness, or syncope.  Does report having palpitations occurring about once every 2 weeks that lasts for less than a minute.  Feels like heart is racing.   Past Medical History:  Diagnosis Date  . Diabetes mellitus without complication (HCC)   . Hypertension     No past surgical history on file.  Current Medications: Current Meds  Medication Sig  . atorvastatin (LIPITOR) 40 MG tablet Take 40 mg by mouth at bedtime.  . fluticasone (FLONASE) 50 MCG/ACT nasal spray Place 2 sprays into both nostrils in the morning and at bedtime.  . insulin glargine (LANTUS) 100 UNIT/ML Solostar Pen Inject 60 Units into the skin at bedtime.  Marland Kitchen loratadine (CLARITIN) 10 MG tablet Take 10 mg by mouth daily as needed for allergies.  Marland Kitchen losartan (COZAAR) 25 MG tablet Take 25 mg by mouth daily.  Marland Kitchen omeprazole (PRILOSEC) 20 MG capsule Take 1 capsule (20 mg total) by mouth daily.  Marland Kitchen tetrahydrozoline-zinc (VISINE-AC) 0.05-0.25 % ophthalmic solution Place 2 drops into both eyes 2 (two) times daily as needed (Dry Eyes).     Allergies:   Patient has no known allergies.   Social History   Socioeconomic History  . Marital status: Married    Spouse name: Not on file  . Number of children: Not on file  . Years of education: Not on file  . Highest education level:  Not on file  Occupational History  . Not on file  Tobacco Use  . Smoking status: Never Smoker  . Smokeless tobacco: Never Used  Substance and Sexual Activity  . Alcohol use: Never  . Drug use: Never  . Sexual activity: Never  Other Topics Concern  . Not on file  Social History Narrative  . Not on file   Social Determinants of Health   Financial Resource Strain:   . Difficulty of Paying Living Expenses:   Food Insecurity:   . Worried About Programme researcher, broadcasting/film/video in the Last Year:   . Barista in the Last Year:   Transportation Needs:   . Freight forwarder  (Medical):   Marland Kitchen Lack of Transportation (Non-Medical):   Physical Activity:   . Days of Exercise per Week:   . Minutes of Exercise per Session:   Stress:   . Feeling of Stress :   Social Connections:   . Frequency of Communication with Friends and Family:   . Frequency of Social Gatherings with Friends and Family:   . Attends Religious Services:   . Active Member of Clubs or Organizations:   . Attends Banker Meetings:   Marland Kitchen Marital Status:      Family History: The patient's family history is negative for Breast cancer.  ROS:   Please see the history of present illness.     All other systems reviewed and are negative.  EKGs/Labs/Other Studies Reviewed:    The following studies were reviewed today:   EKG:  EKG is ordered today.  The ekg ordered demonstrates normal sinus rhythm, rate 94, poor R wave progression, no ST abnormalities  Recent Labs: 04/19/2020: BUN 8; Creatinine, Ser 0.75; Hemoglobin 10.4; Platelets 245; Potassium 4.2; Sodium 136; TSH 1.790  Recent Lipid Panel    Component Value Date/Time   CHOL 164 04/19/2020 1049   TRIG 76 04/19/2020 1049   HDL 59 04/19/2020 1049   CHOLHDL 2.8 04/19/2020 1049   LDLCALC 91 04/19/2020 1049    Physical Exam:    VS:  BP 128/80   Pulse 94   Temp (!) 97 F (36.1 C)   Ht 5\' 4"  (1.626 m)   Wt 212 lb (96.2 kg)   SpO2 97%   BMI 36.39 kg/m     Wt Readings from Last 3 Encounters:  04/19/20 212 lb (96.2 kg)  03/18/20 257 lb (116.6 kg)  02/28/20 257 lb 12.8 oz (116.9 kg)     GEN:  in no acute distress HEENT: Normal NECK: No JVD LYMPHATICS: No lymphadenopathy CARDIAC:RRR, no murmurs, rubs, gallops RESPIRATORY:  Clear to auscultation without rales, wheezing or rhonchi  ABDOMEN: Soft, non-tender, non-distended MUSCULOSKELETAL:  No edema; No deformity  SKIN: Warm and dry NEUROLOGIC:  Alert and oriented x 3 PSYCHIATRIC:  Normal affect   ASSESSMENT:    1. Acute combined systolic and diastolic heart failure  (HCC)   2. Pre-procedure lab exam   3. Type 2 diabetes mellitus without complication, with long-term current use of insulin (HCC)   4. Lipid screening   5. Essential hypertension    PLAN:     Acute combined systolic and diastolic heart failure:  Lexiscan Myoview on 03/19/2020 showed small fixed defect at the apex, no evidence of ischemia but was poor quality study with low cardiac counts compared to extracardiac activity.  EF 31%.  TTE on 04/02/2020 showed basal inferior/inferoseptal akinesis with LVEF 35 to 40%, grade 2 diastolic dysfunction, moderate LV dilatation,  no significant valvular disease, IVC small/collapsible -Continue losartan 25 mg daily -Will start toprol XL 25 mg daily -Given poor quality Lexiscan Myoview and considering patient continues to have atypical chest pain with significant CAD risk factors (type 2 diabetes, hypertension, hyperlipidemia) and segmental wall motion abnormality on echo, would favor definitive evaluation for CAD with catheterization.  Will order LHC/RHC. Risks and benefits of cardiac catheterization have been discussed with the patient.  These include bleeding, infection, kidney damage, stroke, heart attack, death.  The patient understands these risks and is willing to proceed. -Volume status difficult to assess, but does not appear hypervolemic and IVC was small/collapsible on recent TTE.  Will hold off on starting Lasix at this time and follow-up results of RHC  Hypertension: On losartan 25 mg daily.  Add toprol XL 25 mg daily as above  Type 2 diabetes: On Lantus.  Will check A1c.    Hyperlipidemia: Continue atorvastatin 40 mg daily.  Will check lipid panel.  Anemia: Hemoglobin 10.7 on 02/27/2020.  Will check CBC, iron studies  RTC in 2 weeks  Medication Adjustments/Labs and Tests Ordered: Current medicines are reviewed at length with the patient today.  Concerns regarding medicines are outlined above.  Orders Placed This Encounter  Procedures  .  Fe+TIBC+Fer  . Basic metabolic panel  . CBC  . Lipid panel  . TSH  . Hemoglobin A1c  . EKG 12-Lead   Meds ordered this encounter  Medications  . metoprolol succinate (TOPROL XL) 25 MG 24 hr tablet    Sig: Take 1 tablet (25 mg total) by mouth daily.    Dispense:  90 tablet    Refill:  3    Patient Instructions     [facility] CARDIOVASCULAR DIVISION [facility] [address] [place] [address] Dept: [phone] Loc: [phone]  Connie Luna  04/19/2020  You are scheduled for a Cardiac Catheterization on Wednesday, May 26 with Dr. Bryan Lemma.  1. Please arrive at the [facility] (Main Entrance A) at [facility]: [address] [place], [place] [address] at 9:30 AM (This time is two hours before your procedure to ensure your preparation). Free valet parking service is available.   Special note: Every effort is made to have your procedure done on time. Please understand that emergencies sometimes delay scheduled procedures.  2. Diet: Do not eat solid foods after midnight.  The patient may have clear liquids until 5am upon the day of the procedure.  3. Labs: today in office  COVID TEST: Monday 5/24 at 1:45 pm [address]  4. Medication instructions in preparation for your procedure:   Contrast Allergy: No  Hold Losartan AM of procedure  Take 1/2 of your usual dose of insulin the night before procedure, NO insulin AM of procedure   On the morning of your procedure, take your Aspirin and any morning medicines NOT listed above.  You may use sips of water.  5. Plan for one night stay--bring personal belongings. 6. Bring a current list of your medications and current insurance cards. 7. You MUST have a responsible person to drive you home. 8. Someone MUST be with you the first 24 hours after you arrive home or your discharge will be delayed. 9. Please wear clothes that are easy to get on and  off and wear slip-on shoes.  Thank you for allowing [place] to care for you!   -- [facility]  START metoprolol succinate (Toprol XL) 25 mg daily   Follow up with Dr. Gardiner Rhyme in 2 weeks    Signed, Donato Heinz, MD  04/21/2020 4:16 PM    [place]

## 2020-04-20 LAB — BASIC METABOLIC PANEL
BUN/Creatinine Ratio: 11 (ref 9–23)
BUN: 8 mg/dL (ref 6–24)
CO2: 23 mmol/L (ref 20–29)
Calcium: 9.2 mg/dL (ref 8.7–10.2)
Chloride: 101 mmol/L (ref 96–106)
Creatinine, Ser: 0.75 mg/dL (ref 0.57–1.00)
GFR calc Af Amer: 112 mL/min/{1.73_m2} (ref >59)
GFR calc non Af Amer: 97 mL/min/{1.73_m2} (ref >59)
Glucose: 160 mg/dL — ABNORMAL HIGH (ref 65–99)
Potassium: 4.2 mmol/L (ref 3.5–5.2)
Sodium: 136 mmol/L (ref 134–144)

## 2020-04-20 LAB — LIPID PANEL
Chol/HDL Ratio: 2.8 ratio (ref 0.0–4.4)
Cholesterol, Total: 164 mg/dL (ref 100–199)
HDL: 59 mg/dL
LDL Chol Calc (NIH): 91 mg/dL (ref 0–99)
Triglycerides: 76 mg/dL (ref 0–149)
VLDL Cholesterol Cal: 14 mg/dL (ref 5–40)

## 2020-04-20 LAB — CBC
Hematocrit: 35.1 % (ref 34.0–46.6)
Hemoglobin: 10.4 g/dL — ABNORMAL LOW (ref 11.1–15.9)
MCH: 21 pg — ABNORMAL LOW (ref 26.6–33.0)
MCHC: 29.6 g/dL — ABNORMAL LOW (ref 31.5–35.7)
MCV: 71 fL — ABNORMAL LOW (ref 79–97)
Platelets: 245 x10E3/uL (ref 150–450)
RBC: 4.95 x10E6/uL (ref 3.77–5.28)
RDW: 17.8 % — ABNORMAL HIGH (ref 11.7–15.4)
WBC: 5.1 x10E3/uL (ref 3.4–10.8)

## 2020-04-20 LAB — HEMOGLOBIN A1C
Est. average glucose Bld gHb Est-mCnc: 214 mg/dL
Hgb A1c MFr Bld: 9.1 % — ABNORMAL HIGH (ref 4.8–5.6)

## 2020-04-20 LAB — IRON,TIBC AND FERRITIN PANEL
Ferritin: 8 ng/mL — ABNORMAL LOW (ref 15–150)
Iron Saturation: 7 % — CL (ref 15–55)
Iron: 27 ug/dL (ref 27–159)
Total Iron Binding Capacity: 380 ug/dL (ref 250–450)
UIBC: 353 ug/dL (ref 131–425)

## 2020-04-20 LAB — TSH: TSH: 1.79 u[IU]/mL (ref 0.450–4.500)

## 2020-04-22 ENCOUNTER — Other Ambulatory Visit (HOSPITAL_COMMUNITY)
Admission: RE | Admit: 2020-04-22 | Discharge: 2020-04-22 | Disposition: A | Discharge status: Home / Self Care | Payer: Medicaid Other | Source: Ambulatory Visit | Attending: Cardiology | Admitting: Cardiology

## 2020-04-22 ENCOUNTER — Other Ambulatory Visit: Payer: Self-pay | Admitting: *Deleted

## 2020-04-22 DIAGNOSIS — I5041 Acute combined systolic (congestive) and diastolic (congestive) heart failure: Secondary | ICD-10-CM

## 2020-04-22 DIAGNOSIS — Z20822 Contact with and (suspected) exposure to covid-19: Secondary | ICD-10-CM | POA: Insufficient documentation

## 2020-04-22 DIAGNOSIS — Z01812 Encounter for preprocedural laboratory examination: Secondary | ICD-10-CM | POA: Diagnosis present

## 2020-04-22 LAB — SARS CORONAVIRUS 2 (TAT 6-24 HRS): SARS Coronavirus 2: NEGATIVE

## 2020-04-22 MED ORDER — SODIUM CHLORIDE 0.9% FLUSH
3.0000 mL | Freq: Two times a day (BID) | INTRAVENOUS | Status: AC
Start: 2020-04-22 — End: ?

## 2020-04-23 ENCOUNTER — Telehealth: Payer: Self-pay | Admitting: *Deleted

## 2020-04-23 ENCOUNTER — Other Ambulatory Visit: Payer: Self-pay | Admitting: *Deleted

## 2020-04-23 MED ORDER — FERROUS GLUCONATE 324 (38 FE) MG PO TABS
324.0000 mg | ORAL_TABLET | Freq: Every day | ORAL | 3 refills | Status: DC
Start: 2020-04-23 — End: 2020-09-11

## 2020-04-23 NOTE — Telephone Encounter (Signed)
Pt contacted pre-catheterization scheduled at [facility] for: Wednesday Apr 24, 2020 11:30 AM Verified arrival time and place: [facility] Main Entrance A [facility]) at: 9:30 AM   No solid food after midnight prior to cath, clear liquids until 5 AM day of procedure.  Hold: Insulin-1/2 usual Insulin HS prior to procedure Pt states she does not take Insulin in the morning.  AM meds can be  taken pre-cath with sip of water including: ASA 81 mg   Confirmed patient has responsible adult to drive home post procedure and observe 24 hours after arriving home: yes  You are allowed ONE visitor in the waiting room during your procedure. Both you and your visitor must wear masks.      COVID-19 Pre-Screening Questions:  . In the past 7 to 10 days have you had a cough,  shortness of breath, headache, congestion, fever (100 or greater) body aches, chills, sore throat, or sudden loss of taste or sense of smell? no . Have you been around anyone with known Covid 19 in the past 7 to 10 days? no . Have you been around anyone who is awaiting Covid 19 test results in the past 7 to 10 days? no . Have you been around anyone who has mentioned symptoms of Covid 19 within the past 7 to 10 days? no   Reviewed procedure/mask/visitor instructions, COVID-19 screening questions with patient.

## 2020-04-24 ENCOUNTER — Encounter (HOSPITAL_COMMUNITY)
Admission: RE | Disposition: A | Discharge status: Home / Self Care | Payer: Self-pay | Source: Home / Self Care | Attending: Cardiology

## 2020-04-24 ENCOUNTER — Other Ambulatory Visit: Payer: Self-pay

## 2020-04-24 ENCOUNTER — Ambulatory Visit (HOSPITAL_COMMUNITY)
Admission: RE | Admit: 2020-04-24 | Discharge: 2020-04-24 | Disposition: A | Discharge status: Home / Self Care | Payer: Medicaid Other | Attending: Cardiology | Admitting: Cardiology

## 2020-04-24 DIAGNOSIS — E119 Type 2 diabetes mellitus without complications: Secondary | ICD-10-CM | POA: Insufficient documentation

## 2020-04-24 DIAGNOSIS — I5041 Acute combined systolic (congestive) and diastolic (congestive) heart failure: Secondary | ICD-10-CM

## 2020-04-24 DIAGNOSIS — Z8616 Personal history of COVID-19: Secondary | ICD-10-CM | POA: Insufficient documentation

## 2020-04-24 DIAGNOSIS — I110 Hypertensive heart disease with heart failure: Secondary | ICD-10-CM | POA: Diagnosis not present

## 2020-04-24 DIAGNOSIS — I272 Pulmonary hypertension, unspecified: Secondary | ICD-10-CM | POA: Diagnosis not present

## 2020-04-24 DIAGNOSIS — I420 Dilated cardiomyopathy: Secondary | ICD-10-CM | POA: Diagnosis present

## 2020-04-24 DIAGNOSIS — E785 Hyperlipidemia, unspecified: Secondary | ICD-10-CM | POA: Insufficient documentation

## 2020-04-24 DIAGNOSIS — Z794 Long term (current) use of insulin: Secondary | ICD-10-CM | POA: Insufficient documentation

## 2020-04-24 DIAGNOSIS — D649 Anemia, unspecified: Secondary | ICD-10-CM | POA: Insufficient documentation

## 2020-04-24 DIAGNOSIS — R079 Chest pain, unspecified: Secondary | ICD-10-CM | POA: Diagnosis not present

## 2020-04-24 DIAGNOSIS — Z79899 Other long term (current) drug therapy: Secondary | ICD-10-CM | POA: Insufficient documentation

## 2020-04-24 HISTORY — PX: RIGHT/LEFT HEART CATH AND CORONARY ANGIOGRAPHY: CATH118266

## 2020-04-24 LAB — POCT I-STAT EG7
Acid-Base Excess: 0 mmol/L (ref 0.0–2.0)
Bicarbonate: 25.3 mmol/L (ref 20.0–28.0)
Calcium, Ion: 1.21 mmol/L (ref 1.15–1.40)
HCT: 31 % — ABNORMAL LOW (ref 36.0–46.0)
Hemoglobin: 10.5 g/dL — ABNORMAL LOW (ref 12.0–15.0)
O2 Saturation: 78 %
Potassium: 4 mmol/L (ref 3.5–5.1)
Sodium: 138 mmol/L (ref 135–145)
TCO2: 27 mmol/L (ref 22–32)
pCO2, Ven: 44 mmHg (ref 44.0–60.0)
pH, Ven: 7.367 (ref 7.250–7.430)
pO2, Ven: 44 mmHg (ref 32.0–45.0)

## 2020-04-24 LAB — POCT I-STAT 7, (LYTES, BLD GAS, ICA,H+H)
Acid-Base Excess: 0 mmol/L (ref 0.0–2.0)
Bicarbonate: 25.1 mmol/L (ref 20.0–28.0)
Calcium, Ion: 1.22 mmol/L (ref 1.15–1.40)
HCT: 31 % — ABNORMAL LOW (ref 36.0–46.0)
Hemoglobin: 10.5 g/dL — ABNORMAL LOW (ref 12.0–15.0)
O2 Saturation: 100 %
Potassium: 3.8 mmol/L (ref 3.5–5.1)
Sodium: 138 mmol/L (ref 135–145)
TCO2: 26 mmol/L (ref 22–32)
pCO2 arterial: 42.6 mmHg (ref 32.0–48.0)
pH, Arterial: 7.379 (ref 7.350–7.450)
pO2, Arterial: 171 mmHg — ABNORMAL HIGH (ref 83.0–108.0)

## 2020-04-24 LAB — GLUCOSE, CAPILLARY: Glucose-Capillary: 204 mg/dL — ABNORMAL HIGH (ref 70–99)

## 2020-04-24 LAB — PREGNANCY, URINE: Preg Test, Ur: NEGATIVE

## 2020-04-24 SURGERY — RIGHT/LEFT HEART CATH AND CORONARY ANGIOGRAPHY
Anesthesia: LOCAL

## 2020-04-24 MED ORDER — FUROSEMIDE 10 MG/ML IJ SOLN
INTRAMUSCULAR | Status: DC | PRN
  Administered 2020-04-24: 40 mg via INTRAVENOUS

## 2020-04-24 MED ORDER — SODIUM CHLORIDE 0.9 % IV SOLN: 250.0000 mL | INTRAVENOUS | Status: DC | PRN

## 2020-04-24 MED ORDER — SPIRONOLACTONE 25 MG PO TABS
12.5000 mg | ORAL_TABLET | Freq: Every day | ORAL | 3 refills | Status: DC
Start: 2020-04-24 — End: 2020-05-20

## 2020-04-24 MED ORDER — METOPROLOL SUCCINATE ER 25 MG PO TB24
25.0000 mg | ORAL_TABLET | Freq: Every day | ORAL | Status: DC
  Administered 2020-04-24: 25 mg via ORAL
  Filled 2020-04-24: qty 1

## 2020-04-24 MED ORDER — SODIUM CHLORIDE 0.9% FLUSH: 3.0000 mL | Freq: Two times a day (BID) | INTRAVENOUS | Status: DC

## 2020-04-24 MED ORDER — VERAPAMIL HCL 2.5 MG/ML IV SOLN
INTRAVENOUS | Status: DC | PRN
  Administered 2020-04-24: 10 mL via INTRA_ARTERIAL

## 2020-04-24 MED ORDER — METOPROLOL SUCCINATE ER 25 MG PO TB24
50.0000 mg | ORAL_TABLET | Freq: Every day | ORAL | 3 refills | Status: DC
Start: 2020-04-24 — End: 2020-05-20

## 2020-04-24 MED ORDER — SODIUM CHLORIDE 0.9 % WEIGHT BASED INFUSION: 1.0000 mL/kg/h | INTRAVENOUS | Status: DC

## 2020-04-24 MED ORDER — ONDANSETRON HCL 4 MG/2ML IJ SOLN: 4.0000 mg | Freq: Four times a day (QID) | INTRAMUSCULAR | Status: DC | PRN

## 2020-04-24 MED ORDER — SODIUM CHLORIDE 0.9% FLUSH: 3.0000 mL | INTRAVENOUS | Status: DC | PRN

## 2020-04-24 MED ORDER — FENTANYL CITRATE (PF) 100 MCG/2ML IJ SOLN
INTRAMUSCULAR | Status: DC | PRN
  Administered 2020-04-24 (×3): 25 ug via INTRAVENOUS

## 2020-04-24 MED ORDER — VERAPAMIL HCL 2.5 MG/ML IV SOLN
INTRAVENOUS | Status: AC
  Filled 2020-04-24: qty 2

## 2020-04-24 MED ORDER — HEPARIN (PORCINE) IN NACL 1000-0.9 UT/500ML-% IV SOLN
INTRAVENOUS | Status: AC
  Filled 2020-04-24: qty 1000

## 2020-04-24 MED ORDER — MIDAZOLAM HCL 2 MG/2ML IJ SOLN
INTRAMUSCULAR | Status: DC | PRN
  Administered 2020-04-24 (×2): 1 mg via INTRAVENOUS

## 2020-04-24 MED ORDER — ASPIRIN 81 MG PO CHEW
81.0000 mg | CHEWABLE_TABLET | ORAL | Status: AC
  Administered 2020-04-24: 81 mg via ORAL
  Filled 2020-04-24: qty 1

## 2020-04-24 MED ORDER — HYDRALAZINE HCL 20 MG/ML IJ SOLN: 10.0000 mg | INTRAMUSCULAR | Status: DC | PRN

## 2020-04-24 MED ORDER — SODIUM CHLORIDE 0.9 % WEIGHT BASED INFUSION
3.0000 mL/kg/h | INTRAVENOUS | Status: AC
  Administered 2020-04-24: 3 mL/kg/h via INTRAVENOUS

## 2020-04-24 MED ORDER — LIDOCAINE HCL (PF) 1 % IJ SOLN
INTRAMUSCULAR | Status: AC
  Filled 2020-04-24: qty 30

## 2020-04-24 MED ORDER — LABETALOL HCL 5 MG/ML IV SOLN: 10.0000 mg | INTRAVENOUS | Status: DC | PRN

## 2020-04-24 MED ORDER — FUROSEMIDE 10 MG/ML IJ SOLN
INTRAMUSCULAR | Status: AC
  Filled 2020-04-24: qty 4

## 2020-04-24 MED ORDER — FENTANYL CITRATE (PF) 100 MCG/2ML IJ SOLN
INTRAMUSCULAR | Status: AC
  Filled 2020-04-24: qty 2

## 2020-04-24 MED ORDER — LABETALOL HCL 5 MG/ML IV SOLN
INTRAVENOUS | Status: AC
  Filled 2020-04-24: qty 4

## 2020-04-24 MED ORDER — MIDAZOLAM HCL 2 MG/2ML IJ SOLN
INTRAMUSCULAR | Status: AC
  Filled 2020-04-24: qty 2

## 2020-04-24 MED ORDER — HEPARIN SODIUM (PORCINE) 1000 UNIT/ML IJ SOLN
INTRAMUSCULAR | Status: DC | PRN
  Administered 2020-04-24: 5000 [IU] via INTRAVENOUS

## 2020-04-24 MED ORDER — LABETALOL HCL 5 MG/ML IV SOLN
INTRAVENOUS | Status: DC | PRN
  Administered 2020-04-24: 10 mg via INTRAVENOUS

## 2020-04-24 MED ORDER — ACETAMINOPHEN 325 MG PO TABS: 650.0000 mg | ORAL_TABLET | ORAL | Status: DC | PRN

## 2020-04-24 MED ORDER — IOHEXOL 350 MG/ML SOLN
INTRAVENOUS | Status: DC | PRN
  Administered 2020-04-24: 70 mL

## 2020-04-24 MED ORDER — SODIUM CHLORIDE 0.9 % IV SOLN: INTRAVENOUS | Status: DC

## 2020-04-24 MED ORDER — LOSARTAN POTASSIUM 25 MG PO TABS
25.0000 mg | ORAL_TABLET | Freq: Every day | ORAL | Status: DC
  Administered 2020-04-24: 25 mg via ORAL
  Filled 2020-04-24: qty 1

## 2020-04-24 MED ORDER — LIDOCAINE HCL (PF) 1 % IJ SOLN
INTRAMUSCULAR | Status: DC | PRN
  Administered 2020-04-24: 2 mL
  Administered 2020-04-24: 5 mL
  Administered 2020-04-24: 3 mL

## 2020-04-24 MED ORDER — HEPARIN (PORCINE) IN NACL 1000-0.9 UT/500ML-% IV SOLN
INTRAVENOUS | Status: DC | PRN
  Administered 2020-04-24 (×2): 500 mL

## 2020-04-24 MED ORDER — HEPARIN SODIUM (PORCINE) 1000 UNIT/ML IJ SOLN
INTRAMUSCULAR | Status: AC
  Filled 2020-04-24: qty 1

## 2020-04-24 MED ORDER — FUROSEMIDE 40 MG PO TABS
40.0000 mg | ORAL_TABLET | Freq: Every day | ORAL | 3 refills | Status: DC
Start: 2020-04-24 — End: 2020-07-26

## 2020-04-24 SURGICAL SUPPLY — 14 items
CATH BALLN WEDGE 5F 110CM (CATHETERS) ×2 IMPLANT
CATH OPTITORQUE TIG 4.0 5F (CATHETERS) ×2 IMPLANT
DEVICE RAD COMP TR BAND LRG (VASCULAR PRODUCTS) ×2 IMPLANT
GLIDESHEATH SLEND SS 6F .021 (SHEATH) ×2 IMPLANT
GUIDEWIRE INQWIRE 1.5J.035X260 (WIRE) ×1 IMPLANT
INQWIRE 1.5J .035X260CM (WIRE) ×2
KIT HEART LEFT (KITS) ×2 IMPLANT
KIT MICROPUNCTURE NIT STIFF (SHEATH) ×2 IMPLANT
NEEDLE PERC 21GX4CM (NEEDLE) IMPLANT
PACK CARDIAC CATHETERIZATION (CUSTOM PROCEDURE TRAY) ×2 IMPLANT
SHEATH GLIDE SLENDER 4/5FR (SHEATH) ×2 IMPLANT
TRANSDUCER W/STOPCOCK (MISCELLANEOUS) ×2 IMPLANT
TUBING CIL FLEX 10 FLL-RA (TUBING) ×2 IMPLANT
WIRE EMERALD 3MM-J .025X260CM (WIRE) ×2 IMPLANT

## 2020-04-24 NOTE — Interval H&P Note (Signed)
History and Physical Interval Note:  04/24/2020 12:42 PM  Connie Luna  has presented today for surgery, with the diagnosis of heart faillure.  The various methods of treatment have been discussed with the patient and family. After consideration of risks, benefits and other options for treatment, the patient has consented to  Procedure(s): RIGHT/LEFT HEART CATH AND CORONARY ANGIOGRAPHY (N/A)  PERCUTANEOUS CORONARY INTERVENTION  as a surgical intervention.  The patient's history has been reviewed, patient examined, no change in status, stable for surgery.  I have reviewed the patient's chart and labs.  Questions were answered to the patient's satisfaction.    Cath Lab Visit (complete for each Cath Lab visit)  Clinical Evaluation Leading to the Procedure:   ACS: No.  Non-ACS:    Anginal Classification: CCS III  Anti-ischemic medical therapy: Minimal Therapy (1 class of medications)  Non-Invasive Test Results: Equivocal test results - low EF, small area of perfusion abnormality on Myoview (read as LOW RISK)  Prior CABG: No previous CABG    Bryan Lemma

## 2020-04-24 NOTE — Discharge Instructions (Signed)
Radial Site Care  This sheet gives you information about how to care for yourself after your procedure. Your health care provider may also give you more specific instructions. If you have problems or questions, contact your health care provider. What can I expect after the procedure? After the procedure, it is common to have:  Bruising and tenderness at the catheter insertion area. Follow these instructions at home: Medicines  Take over-the-counter and prescription medicines only as told by your health care provider. Insertion site care  Follow instructions from your health care provider about how to take care of your insertion site. Make sure you: ? Wash your hands with soap and water before you change your bandage (dressing). If soap and water are not available, use hand sanitizer. ? Change your dressing as told by your health care provider. ? Leave stitches (sutures), skin glue, or adhesive strips in place. These skin closures may need to stay in place for 2 weeks or longer. If adhesive strip edges start to loosen and curl up, you may trim the loose edges. Do not remove adhesive strips completely unless your health care provider tells you to do that.  Check your insertion site every day for signs of infection. Check for: ? Redness, swelling, or pain. ? Fluid or blood. ? Pus or a bad smell. ? Warmth.  Do not take baths, swim, or use a hot tub until your health care provider approves.  You may shower 24-48 hours after the procedure, or as directed by your health care provider. ? Remove the dressing and gently wash the site with plain soap and water. ? Pat the area dry with a clean towel. ? Do not rub the site. That could cause bleeding.  Do not apply powder or lotion to the site. Activity   For 24 hours after the procedure, or as directed by your health care provider: ? Do not flex or bend the affected arm. ? Do not push or pull heavy objects with the affected arm. ? Do not  drive yourself home from the hospital or clinic. You may drive 24 hours after the procedure unless your health care provider tells you not to. ? Do not operate machinery or power tools.  Do not lift anything that is heavier than 10 lb (4.5 kg), or the limit that you are told, until your health care provider says that it is safe.  Ask your health care provider when it is okay to: ? Return to work or school. ? Resume usual physical activities or sports. ? Resume sexual activity. General instructions  If the catheter site starts to bleed, raise your arm and put firm pressure on the site. If the bleeding does not stop, get help right away. This is a medical emergency.  If you went home on the same day as your procedure, a responsible adult should be with you for the first 24 hours after you arrive home.  Keep all follow-up visits as told by your health care provider. This is important. Contact a health care provider if:  You have a fever.  You have redness, swelling, or yellow drainage around your insertion site. Get help right away if:  You have unusual pain at the radial site.  The catheter insertion area swells very fast.  The insertion area is bleeding, and the bleeding does not stop when you hold steady pressure on the area.  Your arm or hand becomes pale, cool, tingly, or numb. These symptoms may represent a serious problem   that is an emergency. Do not wait to see if the symptoms will go away. Get medical help right away. Call your local emergency services (911 in the [place] Do not drive yourself to the hospital. Summary  After the procedure, it is common to have bruising and tenderness at the site.  Follow instructions from your health care provider about how to take care of your radial site wound. Check the wound every day for signs of infection.  Do not lift anything that is heavier than 10 lb (4.5 kg), or the limit that you are told, until your health care provider says  that it is safe. This information is not intended to replace advice given to you by your health care provider. Make sure you discuss any questions you have with your health care provider. Document Revised: 12/22/2017 Document Reviewed: 12/22/2017 Elsevier Patient Education  2020 [employer]vier Inc.  

## 2020-04-24 NOTE — Progress Notes (Signed)
Report received from Page,RN that client had swelling proximal to TR band and pressure held by [facility] from cath lab and site softer

## 2020-04-24 NOTE — Research (Signed)
[place] Informed Consent   Subject Name: Connie Luna  Subject met inclusion and exclusion criteria.  The informed consent form, study requirements and expectations were reviewed with the subject and questions and concerns were addressed prior to the signing of the consent form.  The subject verbalized understanding of the trail requirements.  The subject agreed to participate in the [facility] trial and signed the informed consent.  The informed consent was obtained prior to performance of any protocol-specific procedures for the subject.  A copy of the signed informed consent was given to the subject and a copy was placed in the subject's medical record.  [name]. 04/24/2020, 11:00am

## 2020-04-24 NOTE — Progress Notes (Signed)
Right radial site without bleeding or hematoma

## 2020-04-24 NOTE — Progress Notes (Signed)
Checked TRB at 1500. Hematoma present, pressure held for 10 min. Became soft, level 0. Nigel Mormon to bedside and assessed pt wrist. Will return if another hematoma occurs.

## 2020-04-24 NOTE — Progress Notes (Signed)
Swelling noted again and instilled 2cc to tr band and held pressure for and soft

## 2020-04-25 LAB — POCT I-STAT EG7
Acid-Base Excess: 0 mmol/L (ref 0.0–2.0)
Bicarbonate: 25.3 mmol/L (ref 20.0–28.0)
Calcium, Ion: 1.22 mmol/L (ref 1.15–1.40)
HCT: 32 % — ABNORMAL LOW (ref 36.0–46.0)
Hemoglobin: 10.9 g/dL — ABNORMAL LOW (ref 12.0–15.0)
O2 Saturation: 79 %
Potassium: 3.8 mmol/L (ref 3.5–5.1)
Sodium: 139 mmol/L (ref 135–145)
TCO2: 27 mmol/L (ref 22–32)
pCO2, Ven: 43.8 mmHg — ABNORMAL LOW (ref 44.0–60.0)
pH, Ven: 7.37 (ref 7.250–7.430)
pO2, Ven: 45 mmHg (ref 32.0–45.0)

## 2020-05-02 ENCOUNTER — Telehealth: Payer: Self-pay | Admitting: Cardiology

## 2020-05-02 NOTE — Telephone Encounter (Signed)
Patient states that she thought she was to have an appt following her heart cath on 04/24/20. I'm not sure how far out to schedule or if she needs an appointment. Please advise.

## 2020-05-02 NOTE — Telephone Encounter (Signed)
Spoke to patient, appt scheduled 6/7 at 2:20 pm with Dr. Bjorn Pippin

## 2020-05-05 NOTE — Progress Notes (Signed)
Cardiology Office Note:    Date:  05/06/2020   ID:  Connie Luna, DOB 1976/11/16, MRN [id number]  PCP:  [name], MD  Cardiologist:  [name], MD  Electrophysiologist:  None   Referring MD: [name], MD   Chief Complaint  Patient presents with  . Congestive Heart Failure    History of Present Illness:    Connie Luna is a 44 y.o. female with a hx of hypertension, hyperlipidemia, diabetes who presents for follow-up.  She was initially seen as a ED follow-up for chest pain on 02/28/2020.  Recent Covid infection in January.  Reports she woke up with chest pain around 3 AM on 3/30.  Described as sharp stabbing pain on left side of chest.  Lasted a few seconds and resolved, would occur repeatedly every few minutes for about 30 minutes.  She also describes a sensation that something is sitting in her chest, describes as feeling like food being stuck, it has been continuous for 1 week.  Denies any shortness of breath.  She typically walks for 20 minutes 2-3 times per week, denies any exertional chest pain or dyspnea.  She has been on losartan for hypertension, but was only taking about once per week.  No smoking history.  No history of heart disease in her immediate family.  Work-up in the ED included D-dimer negative, troponin 24 -> 22.  Lexiscan Myoview on 03/19/2020 showed small fixed defect at the apex, no evidence of ischemia (though no poor quality study with low cardiac counts compared to extracardiac activity), EF 31%.  TTE on 04/02/2020 showed basal inferior/inferoseptal akinesis with LVEF 35 to 40%, grade 2 diastolic dysfunction, moderate LV dilatation, no significant valvular disease, IVC small/collapsible.  RHC/LHC on 04/24/2020 showed normal coronary arteries, RA 7, RV 46/7, PA 36/15/26, PWCP 22, LVEDP 32, CI 4.5.  She was started on Lasix 40 mg daily and spironolactone 12.5 mg daily.  Since last clinic visit, she reports that she continues to have chest pain,  occurs about 1-2 times a week.  States that it feels improved from prior.  She denies any shortness of breath.  Does report she has been taking 800 mg of ibuprofen daily for back/ shoulder pain.   Wt Readings from Last 3 Encounters:  05/06/20 251 lb 6.4 oz (114 kg)  04/24/20 250 lb 14.1 oz (113.8 kg)  04/19/20 212 lb (96.2 kg)      Past Medical History:  Diagnosis Date  . Diabetes mellitus without complication (HCC)   . Hypertension     Past Surgical History:  Procedure Laterality Date  . RIGHT/LEFT HEART CATH AND CORONARY ANGIOGRAPHY N/A 04/24/2020   Procedure: RIGHT/LEFT HEART CATH AND CORONARY ANGIOGRAPHY;  Surgeon: [name], MD;  Location: [facility] INVASIVE CV LAB;  Service: Cardiovascular;  Laterality: N/A;    Current Medications: Current Meds  Medication Sig  . albuterol (VENTOLIN HFA) 108 (90 Base) MCG/ACT inhaler Inhale 2 puffs into the lungs every 6 (six) hours as needed.  . APPLE CIDER VINEGAR PO Take 2 tablets by mouth in the morning and at bedtime. [place]  . aspirin EC 81 MG tablet Take 81 mg by mouth at bedtime.  Marland Kitchen atorvastatin (LIPITOR) 40 MG tablet Take 40 mg by mouth at bedtime.  . ferrous gluconate (FERGON) 324 MG tablet Take 1 tablet (324 mg total) by mouth daily with breakfast.  . fexofenadine (ALLEGRA) 180 MG tablet Take 180 mg by mouth daily.  . fluticasone (FLONASE) 50 MCG/ACT nasal spray Place  2 sprays into both nostrils in the morning and at bedtime.  . furosemide (LASIX) 40 MG tablet Take 1 tablet (40 mg total) by mouth daily.  . insulin glargine (LANTUS) 100 UNIT/ML Solostar Pen Inject 60 Units into the skin at bedtime.  Marland Kitchen losartan (COZAAR) 25 MG tablet Take 25 mg by mouth daily.  . metoprolol succinate (TOPROL XL) 25 MG 24 hr tablet Take 2 tablets (50 mg total) by mouth daily.  Marland Kitchen omeprazole (PRILOSEC) 20 MG capsule Take 1 capsule (20 mg total) by mouth daily. (Patient taking differently: Take 20 mg by mouth daily as needed (Reflux). )  . spironolactone  (ALDACTONE) 25 MG tablet Take 0.5 tablets (12.5 mg total) by mouth daily.  Marland Kitchen tetrahydrozoline-zinc (VISINE-AC) 0.05-0.25 % ophthalmic solution Place 2 drops into both eyes 2 (two) times daily as needed (Dry Eyes).   Current Facility-Administered Medications for the 05/06/20 encounter (Office Visit) with [name], MD  Medication  . sodium chloride flush (NS) 0.9 % injection 3 mL     Allergies:   Patient has no known allergies.   Social History   Socioeconomic History  . Marital status: Married    Spouse name: Not on file  . Number of children: Not on file  . Years of education: Not on file  . Highest education level: Not on file  Occupational History  . Not on file  Tobacco Use  . Smoking status: Never Smoker  . Smokeless tobacco: Never Used  Substance and Sexual Activity  . Alcohol use: Never  . Drug use: Never  . Sexual activity: Never  Other Topics Concern  . Not on file  Social History Narrative  . Not on file   Social Determinants of Health   Financial Resource Strain:   . Difficulty of Paying Living Expenses:   Food Insecurity:   . Worried About Programme researcher, broadcasting/film/video in the Last Year:   . Barista in the Last Year:   Transportation Needs:   . Freight forwarder (Medical):   Marland Kitchen Lack of Transportation (Non-Medical):   Physical Activity:   . Days of Exercise per Week:   . Minutes of Exercise per Session:   Stress:   . Feeling of Stress :   Social Connections:   . Frequency of Communication with Friends and Family:   . Frequency of Social Gatherings with Friends and Family:   . Attends Religious Services:   . Active Member of Clubs or Organizations:   . Attends Banker Meetings:   Marland Kitchen Marital Status:      Family History: The patient's family history is negative for Breast cancer.  ROS:   Please see the history of present illness.     All other systems reviewed and are negative.  EKGs/Labs/Other Studies Reviewed:    The  following studies were reviewed today:   EKG:  EKG is ordered today.  The ekg ordered demonstrates normal sinus rhythm, rate 95, no ST abnormalities  Recent Labs: 04/19/2020: BUN 8; Creatinine, Ser 0.75; Platelets 245; TSH 1.790 04/24/2020: Hemoglobin 10.5; Potassium 4.0; Sodium 138  Recent Lipid Panel    Component Value Date/Time   CHOL 164 04/19/2020 1049   TRIG 76 04/19/2020 1049   HDL 59 04/19/2020 1049   CHOLHDL 2.8 04/19/2020 1049   LDLCALC 91 04/19/2020 1049    Physical Exam:    VS:  BP 120/70   Pulse 95   Temp (!) 97 F (36.1 C)   Ht  5\' 4"  (1.626 m)   Wt 251 lb 6.4 oz (114 kg)   SpO2 96%   BMI 43.15 kg/m     Wt Readings from Last 3 Encounters:  05/06/20 251 lb 6.4 oz (114 kg)  04/24/20 250 lb 14.1 oz (113.8 kg)  04/19/20 212 lb (96.2 kg)     GEN:  in no acute distress HEENT: Normal NECK: No JVD LYMPHATICS: No lymphadenopathy CARDIAC:RRR, no murmurs, rubs, gallops RESPIRATORY:  Clear to auscultation without rales, wheezing or rhonchi  ABDOMEN: Soft, non-tender, non-distended MUSCULOSKELETAL:  No edema; No deformity  SKIN: Warm and dry NEUROLOGIC:  Alert and oriented x 3 PSYCHIATRIC:  Normal affect   ASSESSMENT:    1. Acute combined systolic and diastolic heart failure ([place])   2. Essential hypertension   3. Type 2 diabetes mellitus without complication, with long-term current use of insulin (HCC)   4. Cardiomyopathy, unspecified type ([place])   5. Hyperlipidemia, unspecified hyperlipidemia type   6. Iron deficiency anemia, unspecified iron deficiency anemia type    PLAN:     Acute combined systolic and diastolic heart failure:  Lexiscan Myoview on 03/19/2020 showed small fixed defect at the apex, no evidence of ischemia but was poor quality study with low cardiac counts compared to extracardiac activity.  EF 31%.  TTE on 04/02/2020 showed basal inferior/inferoseptal akinesis with LVEF 35 to 31%, grade 2 diastolic dysfunction, moderate LV dilatation, no  significant valvular disease, IVC small/collapsible.  RHC/LHC on 04/24/2020 showed normal coronary arteries, RA 7, RV 46/7, PA 36/15/26, PWCP 22, LVEDP 32, CI 4.5. -On losartan 25 mg daily.  Will check BMET, if stable renal function and potassium, will switch to Entresto -Continue toprol XL 25 mg daily -Continue spironolactone 12.5 mg daily -Continue Lasix 40 mg daily -Follow-up in pharmacy clinic in 2 weeks to continue to titrate heart failure medications -Has been taking 800 mg of ibuprofen daily for back/shoulder pain.  Recommended avoiding NSAIDs given systolic heart failure and instead using Tylenol for pain. -Cardiac MRI to evaluate etiology of nonischemic cardiomyopathy  Hypertension: On losartan 25 mg daily, Toprol-XL 25 mg daily, spironolactone 12.5 mg daily.  Appears well controlled  Type 2 diabetes: On Lantus.  A1c 9.1.  Will refer to endocrinology  Hyperlipidemia: Continue atorvastatin 40 mg daily.  LDL 91  Anemia: Hemoglobin 10.4 on 04/19/2020.   Iron studies consistent with iron deficiency anemia, started on iron supplementation.  Reports heavy menstrual bleeding, otherwise denies any bleeding issues  RTC in 2 months  Medication Adjustments/Labs and Tests Ordered: Current medicines are reviewed at length with the patient today.  Concerns regarding medicines are outlined above.  Orders Placed This Encounter  Procedures  . MR CARDIAC MORPHOLOGY W WO CONTRAST  . Basic metabolic panel  . Magnesium  . Ambulatory referral to Endocrinology  . EKG 12-Lead   No orders of the defined types were placed in this encounter.   Patient Instructions  Medication Instructions:  Your physician recommends that you continue on your current medications as directed. Please refer to the Current Medication list given to you today.  *If you need a refill on your cardiac medications before your next appointment, please call your pharmacy*   Lab Work: BMET, [place]  If you have labs (blood work)  drawn today and your tests are completely normal, you will receive your results only by: Marland Kitchen MyChart Message (if you have MyChart) OR . A paper copy in the mail If you have any lab test that is abnormal or  we need to change your treatment, we will call you to review the results.   Testing/Procedures: Your physician has requested that you have a cardiac MRI. Cardiac MRI uses a computer to create images of your heart as its beating, producing both still and moving pictures of your heart and major blood vessels. For further information please visit [email or web address]. Please follow the instruction sheet given to you today for more information.  Follow-Up: At [facility], you and your health needs are our priority.  As part of our continuing mission to provide you with exceptional heart care, we have created designated Provider Care Teams.  These Care Teams include your primary Cardiologist (physician) and Advanced Practice Providers (APPs -  Physician Assistants and Nurse Practitioners) who all work together to provide you with the care you need, when you need it.  We recommend signing up for the patient portal called "MyChart".  Sign up information is provided on this After Visit Summary.  MyChart is used to connect with patients for Virtual Visits (Telemedicine).  Patients are able to view lab/test results, encounter notes, upcoming appointments, etc.  Non-urgent messages can be sent to your provider as well.   To learn more about what you can do with MyChart, go to [email or web address].    Your next appointment:   2 week(s) with pharmacist (med titration) 2 months with Dr. Bjorn Pippin      Signed, [name], MD  05/06/2020 5:53 PM    [facility]

## 2020-05-06 ENCOUNTER — Other Ambulatory Visit: Payer: Self-pay

## 2020-05-06 ENCOUNTER — Ambulatory Visit (INDEPENDENT_AMBULATORY_CARE_PROVIDER_SITE_OTHER): Payer: Medicaid Other | Admitting: Cardiology

## 2020-05-06 VITALS — BP 120/70 | HR 95 | Temp 97.0°F | Ht 64.0 in | Wt 251.4 lb

## 2020-05-06 DIAGNOSIS — I10 Essential (primary) hypertension: Secondary | ICD-10-CM

## 2020-05-06 DIAGNOSIS — Z794 Long term (current) use of insulin: Secondary | ICD-10-CM

## 2020-05-06 DIAGNOSIS — E119 Type 2 diabetes mellitus without complications: Secondary | ICD-10-CM | POA: Diagnosis not present

## 2020-05-06 DIAGNOSIS — I5041 Acute combined systolic (congestive) and diastolic (congestive) heart failure: Secondary | ICD-10-CM | POA: Diagnosis not present

## 2020-05-06 DIAGNOSIS — I429 Cardiomyopathy, unspecified: Secondary | ICD-10-CM

## 2020-05-06 DIAGNOSIS — D509 Iron deficiency anemia, unspecified: Secondary | ICD-10-CM

## 2020-05-06 DIAGNOSIS — E785 Hyperlipidemia, unspecified: Secondary | ICD-10-CM

## 2020-05-06 NOTE — Patient Instructions (Signed)
Medication Instructions:  Your physician recommends that you continue on your current medications as directed. Please refer to the Current Medication list given to you today.  *If you need a refill on your cardiac medications before your next appointment, please call your pharmacy*   Lab Work: BMET, [place]  If you have labs (blood work) drawn today and your tests are completely normal, you will receive your results only by: Marland Kitchen MyChart Message (if you have MyChart) OR . A paper copy in the mail If you have any lab test that is abnormal or we need to change your treatment, we will call you to review the results.   Testing/Procedures: Your physician has requested that you have a cardiac MRI. Cardiac MRI uses a computer to create images of your heart as its beating, producing both still and moving pictures of your heart and major blood vessels. For further information please visit [email or web address]. Please follow the instruction sheet given to you today for more information.  Follow-Up: At [facility], you and your health needs are our priority.  As part of our continuing mission to provide you with exceptional heart care, we have created designated Provider Care Teams.  These Care Teams include your primary Cardiologist (physician) and Advanced Practice Providers (APPs -  Physician Assistants and Nurse Practitioners) who all work together to provide you with the care you need, when you need it.  We recommend signing up for the patient portal called "MyChart".  Sign up information is provided on this After Visit Summary.  MyChart is used to connect with patients for Virtual Visits (Telemedicine).  Patients are able to view lab/test results, encounter notes, upcoming appointments, etc.  Non-urgent messages can be sent to your provider as well.   To learn more about what you can do with MyChart, go to [email or web address].    Your next appointment:   2 week(s) with pharmacist (med titration) 2  months with Dr. Bjorn Pippin

## 2020-05-07 LAB — BASIC METABOLIC PANEL
BUN/Creatinine Ratio: 14 (ref 9–23)
BUN: 11 mg/dL (ref 6–24)
CO2: 19 mmol/L — ABNORMAL LOW (ref 20–29)
Calcium: 9.2 mg/dL (ref 8.7–10.2)
Chloride: 101 mmol/L (ref 96–106)
Creatinine, Ser: 0.77 mg/dL (ref 0.57–1.00)
GFR calc Af Amer: 109 mL/min/{1.73_m2} (ref >59)
GFR calc non Af Amer: 94 mL/min/{1.73_m2} (ref >59)
Glucose: 230 mg/dL — ABNORMAL HIGH (ref 65–99)
Potassium: 4.1 mmol/L (ref 3.5–5.2)
Sodium: 137 mmol/L (ref 134–144)

## 2020-05-07 LAB — MAGNESIUM: Magnesium: 1.8 mg/dL (ref 1.6–2.3)

## 2020-05-08 ENCOUNTER — Telehealth: Payer: Self-pay | Admitting: Cardiology

## 2020-05-08 DIAGNOSIS — I10 Essential (primary) hypertension: Secondary | ICD-10-CM

## 2020-05-08 DIAGNOSIS — Z5181 Encounter for therapeutic drug level monitoring: Secondary | ICD-10-CM

## 2020-05-08 MED ORDER — ENTRESTO 24-26 MG PO TABS: 1.0000 | ORAL_TABLET | Freq: Two times a day (BID) | ORAL | 5 refills | Status: DC

## 2020-05-08 NOTE — Telephone Encounter (Signed)
New message  Patient is returning your call for lab results. Please call. 

## 2020-05-08 NOTE — Telephone Encounter (Signed)
Advised patient, verbalized understanding  

## 2020-05-08 NOTE — Telephone Encounter (Signed)
-----   Message from [name], MD sent at 05/07/2020  9:12 AM EDT ----- Stable kidney function and potassium, recommend switching from losartan to entresto 24-26 mg BID.  Repeat BMET in 1 week

## 2020-05-09 ENCOUNTER — Telehealth: Payer: Self-pay | Admitting: *Deleted

## 2020-05-09 NOTE — Telephone Encounter (Signed)
Received notification from Cover my meds, PA needed for [facility] 24/26.   PA submitted to [employer] tracks via Cover my meds-pending review.

## 2020-05-17 ENCOUNTER — Encounter: Payer: Self-pay | Admitting: Cardiology

## 2020-05-17 ENCOUNTER — Telehealth: Payer: Self-pay | Admitting: *Deleted

## 2020-05-17 ENCOUNTER — Telehealth: Payer: Self-pay | Admitting: Cardiology

## 2020-05-17 NOTE — Telephone Encounter (Signed)
Routed to primary nurse - is a PA needed?

## 2020-05-17 NOTE — Telephone Encounter (Signed)
Submitted PA through [employer]  PA approved Confirmation # [id number]

## 2020-05-17 NOTE — Telephone Encounter (Signed)
Spoke with patient regarding appointment for Cardiac MRI scheduled Monday 06/17/20 at 8:00 am at [facility]---arrival time is 7:15 am--1st floor admissions office.  Will mail information to patient and it is also in My Chart.  Patient voiced her understanding.

## 2020-05-17 NOTE — Telephone Encounter (Signed)
See previous phone encounter- PA completed and approved.  Patient made aware.    Called pharmacy to verify they are aware of approval.  They are aware and will prepare for pick up.

## 2020-05-17 NOTE — Telephone Encounter (Signed)
Received notification from [place] tracks-Entresto PA was denied.    Policy requirements that patient did not meet: 1. Reauthorization requests must include documentation that the beneficiary is receiving clinical benefit from the medication such as stabilization of symptoms, improvement or stability of EF, ora reduction in hospitalizations. 2. Coverage consideration is provided if the beneficiary is currently taking an ACE inhibitor or ARB, Entresto must replace that current therapy.  If the beneficiary has diabetes, he or she cannot be taking a medication containing aliskiren.    Discussed with pharmD, will reattempt through  tracks, patient should meet criteria.

## 2020-05-17 NOTE — Telephone Encounter (Signed)
Pt c/o medication issue:  1. Name of Medication: sacubitril-valsartan (ENTRESTO) 24-26 MG  2. How are you currently taking this medication (dosage and times per day)? Pt has not started taking this medication yet   3. Are you having a reaction (difficulty breathing--STAT)? no  4. What is your medication issue? PT tried to pick this medication up from her Pharmacy and her Pharmacy said she would need approval from her Doctor.  The patient was on losartan before but Dr. Bjorn Pippin wants her to start taking the entresto. She is out of the [facility] and will not be able to start tanking the Losartan

## 2020-05-20 ENCOUNTER — Other Ambulatory Visit: Payer: Self-pay

## 2020-05-20 ENCOUNTER — Encounter: Payer: Self-pay | Admitting: Pharmacist

## 2020-05-20 ENCOUNTER — Ambulatory Visit (INDEPENDENT_AMBULATORY_CARE_PROVIDER_SITE_OTHER): Payer: Medicaid Other | Admitting: Pharmacist

## 2020-05-20 VITALS — BP 102/70 | HR 87 | Wt 255.0 lb

## 2020-05-20 DIAGNOSIS — I5041 Acute combined systolic (congestive) and diastolic (congestive) heart failure: Secondary | ICD-10-CM | POA: Diagnosis not present

## 2020-05-20 MED ORDER — METOPROLOL SUCCINATE ER 100 MG PO TB24
100.0000 mg | ORAL_TABLET | Freq: Every day | ORAL | 2 refills | Status: DC
Start: 2020-05-20 — End: 2020-07-02

## 2020-05-20 MED ORDER — SPIRONOLACTONE 25 MG PO TABS
25.0000 mg | ORAL_TABLET | Freq: Every day | ORAL | 2 refills | Status: DC
Start: 2020-05-20 — End: 2020-07-16

## 2020-05-20 NOTE — Patient Instructions (Addendum)
It was great meeting you today!  Your blood pressure goal is <130/80  Continue your Entresto 24/26 twice daily and your furosemide 40 mg daily.  We are going to increase your spironolactone to 25 mg once a day and your metoprolol succinate to 100mg  once a day  Please call with any issues or adverse effects  Continue exercising 30 minutes a day at least 5 days a week  Try to limit salt and fast foods  , PharmD, Laural Golden, CDCES  [facility] [address], [place], [place] Phone: [phone]; Fax: [phone] 05/20/2020 3:00 PM

## 2020-05-20 NOTE — Progress Notes (Signed)
Patient ID: Connie Luna                 DOB: Jul 16, 1976                      MRN: [id number]     HPI: Connie Luna is a 44 y.o. female referred by Dr. Gardiner Luna to pharmacist managed CHF clinic. PMH is significant for CHF, HTN, HLD and DM.  EF on myoview on 03/19/20 was 31%.  TTE on 04/02/20 showed EF 35-40%.  At last visit with Dr. Gardiner Luna on 05/06/20, patient complained about occasional chest pain but denied SOB.  Plan was to check BMET and if renal function and K normal, switch to [facility] and recheck BMP in 1 week.  Patient was prescribed Entresto but has not had updated BMP drawn.  Was able to pick up Entresto on 05/18/20 and started that day and d/c losartan.    DM not controlled, last A1c 9.1%.  Only on Lantus 60 units nightly.  Reports fasting blood sugars between 180-200. Takes atorvastatin 40mg  for lipid control.  Previous LDL 91 on 04/19/20.  Has BP cuff at home but is not using it.  Is exercising at least 30 minutes a day at least 4 times a week.   Current CHF meds: Lasix 40 mg daily, spironolactone 12.5 mg daily, metoprolol succinate 50 mg daily, Entresto 24/26 BID Previously tried: losartan 25 mg daily BP goal: <130/80  Family History:  Both grandmothers had heart disease  Social History: no smoking history, very occasional EtOH   Diet: Drinks water, diet soda. Eats a lot of fried food.  Eats out twice a week at fast food restaurants.  Dinner is salmon, salads, asparagus, green beans, broccoli, chicken (fried/rotisserie), [place] burgers  Exercise: Started exercising in April.  30 minutes of exercise (walking) at least 4 times a week  Home BP readings: is not checking  Wt Readings from Last 3 Encounters:  05/06/20 251 lb 6.4 oz (114 kg)  04/24/20 250 lb 14.1 oz (113.8 kg)  04/19/20 212 lb (96.2 kg)   BP Readings from Last 3 Encounters:  05/06/20 120/70  04/24/20 122/80  04/19/20 128/80   Pulse Readings from Last 3 Encounters:  05/06/20 95  04/24/20 86  04/19/20 94      Renal function: Estimated Creatinine Clearance: 111.1 mL/min (by C-G formula based on SCr of 0.77 mg/dL).  Past Medical History:  Diagnosis Date  . Diabetes mellitus without complication ([place])   . Hypertension     Current Outpatient Medications on File Prior to Visit  Medication Sig Dispense Refill  . albuterol (VENTOLIN HFA) 108 (90 Base) MCG/ACT inhaler Inhale 2 puffs into the lungs every 6 (six) hours as needed.    . APPLE CIDER VINEGAR PO Take 2 tablets by mouth in the morning and at bedtime. [place]    . aspirin EC 81 MG tablet Take 81 mg by mouth at bedtime.    Marland Kitchen atorvastatin (LIPITOR) 40 MG tablet Take 40 mg by mouth at bedtime.    . ferrous gluconate (FERGON) 324 MG tablet Take 1 tablet (324 mg total) by mouth daily with breakfast. 90 tablet 3  . fexofenadine (ALLEGRA) 180 MG tablet Take 180 mg by mouth daily.    . fluticasone (FLONASE) 50 MCG/ACT nasal spray Place 2 sprays into both nostrils in the morning and at bedtime.    . furosemide (LASIX) 40 MG tablet Take 1 tablet (40 mg total) by mouth daily. 30 tablet  3  . insulin glargine (LANTUS) 100 UNIT/ML Solostar Pen Inject 60 Units into the skin at bedtime.    . metoprolol succinate (TOPROL XL) 25 MG 24 hr tablet Take 2 tablets (50 mg total) by mouth daily. 90 tablet 3  . omeprazole (PRILOSEC) 20 MG capsule Take 1 capsule (20 mg total) by mouth daily. (Patient taking differently: Take 20 mg by mouth daily as needed (Reflux). ) 1 capsule 14  . sacubitril-valsartan (ENTRESTO) 24-26 MG Take 1 tablet by mouth 2 (two) times daily. 60 tablet 5  . spironolactone (ALDACTONE) 25 MG tablet Take 0.5 tablets (12.5 mg total) by mouth daily. 15 tablet 3  . tetrahydrozoline-zinc (VISINE-AC) 0.05-0.25 % ophthalmic solution Place 2 drops into both eyes 2 (two) times daily as needed (Dry Eyes).     Current Facility-Administered Medications on File Prior to Visit  Medication Dose Route Frequency Provider Last Rate Last Admin  . sodium chloride  flush (NS) 0.9 % injection 3 mL  3 mL Intravenous Q12H Connie Ishikawa, MD        No Known Allergies   Assessment/Plan:  1. Acute combined systolic and diastolic HF - Educated patient on goals of HF management and medication titration.  Encouraged patient to begin checking BP at home 1-2 hours after morning medications and to bring in BP cuff at next follow up.  Congratulated patient on exercise routine and encouraged her to continue.  Recommended patient cut back on fried and fatty foods, especially fast food and try to make most meals at home.  All HF medications are able to be titrated up, however since she just started Entresto two days ago, will start by titrating up metoprolol and spironolactone since patient has been tolerating well.  Counseled patient to call clinic with any adverse effects.  Scheduled patient for labwork on Friday 6/25 and will recheck in 4 weeks.  Patient voiced understanding.

## 2020-05-24 ENCOUNTER — Other Ambulatory Visit: Payer: Self-pay

## 2020-05-24 ENCOUNTER — Other Ambulatory Visit: Payer: Medicaid Other | Admitting: *Deleted

## 2020-05-25 LAB — BASIC METABOLIC PANEL
BUN/Creatinine Ratio: 9 (ref 9–23)
BUN: 8 mg/dL (ref 6–24)
CO2: 24 mmol/L (ref 20–29)
Calcium: 9.4 mg/dL (ref 8.7–10.2)
Chloride: 96 mmol/L (ref 96–106)
Creatinine, Ser: 0.91 mg/dL (ref 0.57–1.00)
GFR calc Af Amer: 89 mL/min/{1.73_m2} (ref >59)
GFR calc non Af Amer: 77 mL/min/{1.73_m2} (ref >59)
Glucose: 291 mg/dL — ABNORMAL HIGH (ref 65–99)
Potassium: 4 mmol/L (ref 3.5–5.2)
Sodium: 135 mmol/L (ref 134–144)

## 2020-05-27 ENCOUNTER — Telehealth: Payer: Self-pay | Admitting: Pharmacist

## 2020-05-27 NOTE — Telephone Encounter (Signed)
Spoke with patient regarding lab results.  Kidney function and electrolytes WNL since increasing metoprolol succinate and spironolactone.  Patient reports no adverse effects.  Advised to keep appt in 3 weeks for med titration.

## 2020-06-17 ENCOUNTER — Other Ambulatory Visit (HOSPITAL_COMMUNITY): Payer: Medicaid Other

## 2020-06-18 ENCOUNTER — Ambulatory Visit: Payer: Medicaid Other

## 2020-06-18 ENCOUNTER — Telehealth: Payer: Self-pay | Admitting: Cardiology

## 2020-06-18 NOTE — Telephone Encounter (Signed)
Left message for patient to call and discuss rescheduling Cardiac MRI ordered by Dr. Schumann °

## 2020-06-19 NOTE — Telephone Encounter (Signed)
Left message for patient to call to discuss new appointment date and time for Cardiac Mri ordered by Dr. Bjorn Pippin

## 2020-06-20 ENCOUNTER — Encounter: Payer: Self-pay | Admitting: *Deleted

## 2020-06-20 NOTE — Telephone Encounter (Signed)
Left message for patient to call and discuss appointment change for Cardiac MRI ordered by Dr. Bjorn Pippin

## 2020-06-20 NOTE — Telephone Encounter (Signed)
Patient returned my call and verbalized her understanding of the new appointment for the Cardiac MRI 07/04/20 at 2:00 pm.  Will mail updated information.

## 2020-06-24 ENCOUNTER — Other Ambulatory Visit: Payer: Medicaid Other

## 2020-06-27 ENCOUNTER — Ambulatory Visit: Payer: Medicaid Other | Admitting: "Endocrinology

## 2020-06-28 ENCOUNTER — Ambulatory Visit: Payer: Medicaid Other

## 2020-07-02 ENCOUNTER — Ambulatory Visit (INDEPENDENT_AMBULATORY_CARE_PROVIDER_SITE_OTHER): Payer: Medicaid Other | Admitting: Pharmacist

## 2020-07-02 ENCOUNTER — Other Ambulatory Visit: Payer: Self-pay

## 2020-07-02 VITALS — BP 110/72 | HR 80

## 2020-07-02 DIAGNOSIS — I5041 Acute combined systolic (congestive) and diastolic (congestive) heart failure: Secondary | ICD-10-CM

## 2020-07-02 MED ORDER — METOPROLOL SUCCINATE ER 100 MG PO TB24: 150.0000 mg | ORAL_TABLET | Freq: Every day | ORAL | 3 refills | Status: DC

## 2020-07-02 NOTE — Patient Instructions (Addendum)
It was nice to meet you!  Please increase your metoprolol to 150mg  (1.5 tablets) daily. Please start checking your blood pressure at home. Bring your log and meter to next appointment. Continue weighting yourself daily. Call if you gain 3 or more pounds overnight or 5lb in a week.  Before checking your blood pressure make sure: You are seated and quite for 5 min before checking Feet are flat on the floor Siting in chair with your back supported straight up and down Arm resting on table or arm of chair at heart level Bladder is empty You have NOT had caffeine or tobacco within the last 30 min  Check your blood pressure 2-3 times about 1-2 min apart. Usually the first reading will be the highest. Record these readings.  Only check your blood pressure once a day, unless otherwise directed Record your blood pressure readings and bring them to all your appointments. If your meter stores your readings in its memory, then you may bring your blood pressure meter with you to your appointments.  You can find a list of validated (accurate) blood pressure cuffs at [place]  Call me at [phone] with any questions or concerns

## 2020-07-02 NOTE — Progress Notes (Signed)
Patient ID: Connie Luna                 DOB: [birth year]                      MRN: [id number]     HPI: Connie Luna is a 44 y.o. female referred by Dr. Bjorn Pippin to pharmacist managed CHF clinic. PMH is significant for CHF, HTN, HLD and DM.  EF on myoview on 03/19/20 was 31%.  TTE on 04/02/20 showed EF 35-40%.  At last visit with Dr. Bjorn Pippin on 05/06/20, patient complained about occasional chest pain but denied SOB.  Plan was to check BMET and if renal function and K normal, switch to [facility] and recheck BMP in 1 week.  Patient was prescribed Entresto but has not had updated BMP drawn.  Was able to pick up Entresto on 05/18/20 and started that day and d/c losartan.    DM not controlled, last A1c 9.1%.  Only on Lantus 60 units nightly.  Reports fasting blood sugars between 180-200. Takes atorvastatin 40mg  for lipid control.  Previous LDL 91 on 04/19/20.  Has BP cuff at home but is not using it.  Is exercising at least 30 minutes a day at least 4 times a week.  At last visit with PharmD clinic, spironolactone was increased to 25mg  daily and metoprolol was increased to 100mg  daily. Follow up labs relatively stable with a small increase in scr.  Patient presents today to clinic for follow up. She is tolerating medications well and in compliant. She however, does not check her blood pressure at home. Does weigh herself daily and states her weight is stable. Is walking every day. No swelling, SOB, dizziness or lightheadedness.   Current CHF meds: Lasix 40 mg daily, spironolactone 25 mg daily, metoprolol succinate 100 mg daily, Entresto 24/26 BID Previously tried: losartan 25 mg daily BP goal: <130/80  Family History:  Both grandmothers had heart disease  Social History: no smoking history, very occasional EtOH   Diet: Drinks water, diet soda. Eats a lot of fried food.  Eats out twice a week at fast food restaurants.  Dinner is salmon, salads, asparagus, green beans, broccoli, chicken  (fried/rotisserie), 04/21/20 burgers  Exercise: Started exercising in April.  20-25 minutes of exercise (walking) daily+ squats  Home BP readings: is not checking  Wt Readings from Last 3 Encounters:  05/20/20 255 lb (115.7 kg)  05/06/20 251 lb 6.4 oz (114 kg)  04/24/20 250 lb 14.1 oz (113.8 kg)   BP Readings from Last 3 Encounters:  05/20/20 102/70  05/06/20 120/70  04/24/20 122/80   Pulse Readings from Last 3 Encounters:  05/20/20 87  05/06/20 95  04/24/20 86    Renal function: CrCl cannot be calculated (Patient's most recent lab result is older than the maximum 21 days allowed.).  Past Medical History:  Diagnosis Date   Diabetes mellitus without complication (HCC)    Hypertension     Current Outpatient Medications on File Prior to Visit  Medication Sig Dispense Refill   albuterol (VENTOLIN HFA) 108 (90 Base) MCG/ACT inhaler Inhale 2 puffs into the lungs every 6 (six) hours as needed.     APPLE CIDER VINEGAR PO Take 2 tablets by mouth in the morning and at bedtime. Goli     aspirin EC 81 MG tablet Take 81 mg by mouth at bedtime.     atorvastatin (LIPITOR) 40 MG tablet Take 40 mg by mouth at bedtime.  ferrous gluconate (FERGON) 324 MG tablet Take 1 tablet (324 mg total) by mouth daily with breakfast. 90 tablet 3   fexofenadine (ALLEGRA) 180 MG tablet Take 180 mg by mouth daily.     fluticasone (FLONASE) 50 MCG/ACT nasal spray Place 2 sprays into both nostrils in the morning and at bedtime.     furosemide (LASIX) 40 MG tablet Take 1 tablet (40 mg total) by mouth daily. 30 tablet 3   insulin glargine (LANTUS) 100 UNIT/ML Solostar Pen Inject 60 Units into the skin at bedtime.     metoprolol succinate (TOPROL-XL) 100 MG 24 hr tablet Take 1 tablet (100 mg total) by mouth daily. Take with or immediately following a meal. 30 tablet 2   omeprazole (PRILOSEC) 20 MG capsule Take 1 capsule (20 mg total) by mouth daily. (Patient taking differently: Take 20 mg by mouth  daily as needed (Reflux). ) 1 capsule 14   sacubitril-valsartan (ENTRESTO) 24-26 MG Take 1 tablet by mouth 2 (two) times daily. 60 tablet 5   spironolactone (ALDACTONE) 25 MG tablet Take 1 tablet (25 mg total) by mouth daily. 30 tablet 2   tetrahydrozoline-zinc (VISINE-AC) 0.05-0.25 % ophthalmic solution Place 2 drops into both eyes 2 (two) times daily as needed (Dry Eyes).     Current Facility-Administered Medications on File Prior to Visit  Medication Dose Route Frequency Provider Last Rate Last Admin   sodium chloride flush (NS) 0.9 % injection 3 mL  3 mL Intravenous Q12H [name], MD        No Known Allergies   Assessment/Plan:  1. Acute combined systolic and diastolic HF - Patients blood pressure in clinic today has room to increase Entresto, however I do not have any home readings. I have asked patient to start checking her blood pressure at home and bring her readings and her machine with her to her next appointment. Today, I will increase metoprolol succinate to 150mg  daily. Explained to patient the goal of getting medications to target dose if possible. At next visit if home BP readings are not too low, will increase Entresto to 49/51mg  BID. I will also check another BMP today. With last lab, patient was only on Entresto for 2 days. I want to make sure patient is not too dry with furosemide 40mg  and Entresto. I will call patient tomorrow with results and any changes if needed. Follow up in clinic in 2 weeks.  , Pharm.D, BCPS, CPP [facility]  [address], [place], [address] [place]  Phone: [phone]; Fax: [phone]  '

## 2020-07-03 ENCOUNTER — Telehealth (HOSPITAL_COMMUNITY): Payer: Self-pay | Admitting: *Deleted

## 2020-07-03 ENCOUNTER — Telehealth: Payer: Self-pay | Admitting: Pharmacist

## 2020-07-03 LAB — BASIC METABOLIC PANEL
BUN/Creatinine Ratio: 13 (ref 9–23)
BUN: 11 mg/dL (ref 6–24)
CO2: 24 mmol/L (ref 20–29)
Calcium: 9.4 mg/dL (ref 8.7–10.2)
Chloride: 99 mmol/L (ref 96–106)
Creatinine, Ser: 0.82 mg/dL (ref 0.57–1.00)
GFR calc Af Amer: 100 mL/min/{1.73_m2} (ref >59)
GFR calc non Af Amer: 87 mL/min/{1.73_m2} (ref >59)
Glucose: 401 mg/dL — ABNORMAL HIGH (ref 65–99)
Potassium: 4.3 mmol/L (ref 3.5–5.2)
Sodium: 136 mmol/L (ref 134–144)

## 2020-07-03 NOTE — Telephone Encounter (Signed)
Reaching out to patient to offer assistance regarding upcoming cardiac imaging study; pt verbalizes understanding of appt date/time, parking situation and where to check in; name and call back number provided for further questions should they arise  Mayari Matus Tai RN Navigator Cardiac Imaging [facility] [phone] office [phone] cell  

## 2020-07-03 NOTE — Telephone Encounter (Signed)
Labs are stable. Continue current medications. Follow up in office in 2 weeks as planned. Called patient. Reviewed labs. Patient in agreement with plan.

## 2020-07-04 ENCOUNTER — Ambulatory Visit (HOSPITAL_COMMUNITY): Admission: RE | Admit: 2020-07-04 | Payer: Medicaid Other | Source: Ambulatory Visit

## 2020-07-15 ENCOUNTER — Other Ambulatory Visit: Payer: Self-pay | Admitting: Cardiology

## 2020-07-15 DIAGNOSIS — I5041 Acute combined systolic (congestive) and diastolic (congestive) heart failure: Secondary | ICD-10-CM

## 2020-07-16 ENCOUNTER — Ambulatory Visit: Payer: Medicaid Other

## 2020-07-16 NOTE — Progress Notes (Deleted)
Patient ID: Connie Luna                 DOB: 1976-04-12                      MRN: [id number]     HPI: Connie Luna is a 44 y.o. female referred by Dr. Bjorn Pippin for CHF management. PMH is significant for dilated cardiomyopathy, CHF, T2DM, HLD, and iron deficiency anemia.  EF on 03/19/20 was 31%, improved to 35-40% on 04/02/20.    DM not controlled, last A1c 9.1%.  Only on Lantus 60 units nightly.  Reports fasting blood sugars between 180-200. Takes atorvastatin 40mg  for lipid control.  Previous LDL 91 on 04/19/20.  Has BP cuff at home but is not using it.  Is exercising at least 30 minutes a day at least 4 times a week.  Patient has been encouraged to check BP at home.  This is third visit with PharmD.  Spironolactone has been titrated up to 25 mg daily and metropolol has been increased to 150mg  daily.  Entresto had not yet been titrated due to pending lab work.  Lab work from 8/3 has since come back showing electrolytes and renal function WNL.     Current HTN meds: metoprolol succinate 150mg  daily, Entresto 24/26 BID, spironolactone 25 mg daily, furosemide 40 mg daily Previously tried: losartan 25 mg BP goal: <130/80   Family History: Both grandmothers had heart disease  Social History: no smoking history, very occasional EtOH   Diet: Drinks water, diet soda. Eats a lot of fried food.  Eats out twice a week at fast food restaurants.  Dinner is salmon, salads, asparagus, green beans, broccoli, chicken (fried/rotisserie), 04/21/20 burgers  Exercise:   Home BP readings:   Wt Readings from Last 3 Encounters:  05/20/20 255 lb (115.7 kg)  05/06/20 251 lb 6.4 oz (114 kg)  04/24/20 250 lb 14.1 oz (113.8 kg)   BP Readings from Last 3 Encounters:  07/02/20 110/72  05/20/20 102/70  05/06/20 120/70   Pulse Readings from Last 3 Encounters:  07/02/20 80  05/20/20 87  05/06/20 95    Renal function: CrCl cannot be calculated (Unknown ideal weight.).  Past Medical History:  Diagnosis  Date  . Diabetes mellitus without complication (HCC)   . Hypertension     Current Outpatient Medications on File Prior to Visit  Medication Sig Dispense Refill  . albuterol (VENTOLIN HFA) 108 (90 Base) MCG/ACT inhaler Inhale 2 puffs into the lungs every 6 (six) hours as needed.    . APPLE CIDER VINEGAR PO Take 2 tablets by mouth in the morning and at bedtime. Goli    . aspirin EC 81 MG tablet Take 81 mg by mouth at bedtime.    09/01/20 atorvastatin (LIPITOR) 40 MG tablet Take 40 mg by mouth at bedtime.    . ferrous gluconate (FERGON) 324 MG tablet Take 1 tablet (324 mg total) by mouth daily with breakfast. 90 tablet 3  . fexofenadine (ALLEGRA) 180 MG tablet Take 180 mg by mouth daily.    . fluticasone (FLONASE) 50 MCG/ACT nasal spray Place 2 sprays into both nostrils in the morning and at bedtime.    . furosemide (LASIX) 40 MG tablet Take 1 tablet (40 mg total) by mouth daily. 30 tablet 3  . insulin glargine (LANTUS) 100 UNIT/ML Solostar Pen Inject 60 Units into the skin at bedtime.    . metoprolol succinate (TOPROL-XL) 100 MG 24 hr tablet Take 1.5 tablets (  150 mg total) by mouth daily. Take with or immediately following a meal. 135 tablet 3  . omeprazole (PRILOSEC) 20 MG capsule Take 1 capsule (20 mg total) by mouth daily. (Patient taking differently: Take 20 mg by mouth daily as needed (Reflux). ) 1 capsule 14  . sacubitril-valsartan (ENTRESTO) 24-26 MG Take 1 tablet by mouth 2 (two) times daily. 60 tablet 5  . spironolactone (ALDACTONE) 25 MG tablet Take 1 tablet (25 mg total) by mouth daily. 30 tablet 2  . tetrahydrozoline-zinc (VISINE-AC) 0.05-0.25 % ophthalmic solution Place 2 drops into both eyes 2 (two) times daily as needed (Dry Eyes).     Current Facility-Administered Medications on File Prior to Visit  Medication Dose Route Frequency Provider Last Rate Last Admin  . sodium chloride flush (NS) 0.9 % injection 3 mL  3 mL Intravenous Q12H [name], MD        No Known  Allergies   Assessment/Plan:  1. CHF -

## 2020-07-18 ENCOUNTER — Telehealth: Payer: Self-pay | Admitting: Cardiology

## 2020-07-18 NOTE — Telephone Encounter (Signed)
Left message for patient to call and discuss new appointment for Cardiac MRI ordered by Dr. Bjorn Pippin

## 2020-07-19 ENCOUNTER — Encounter: Payer: Self-pay | Admitting: Cardiology

## 2020-07-19 NOTE — Telephone Encounter (Signed)
Left message regarding appointemnt for Cardiac MRI scheduled  08/13/20 at 12:00pm at [facility]---arrival time is 11:30 am at the 1st floor admissions office .  Will mail information to patient and it is also in My Chart.

## 2020-07-26 ENCOUNTER — Other Ambulatory Visit: Payer: Self-pay | Admitting: Cardiology

## 2020-07-28 NOTE — Progress Notes (Deleted)
Cardiology Office Note:    Date:  07/28/2020   ID:  Connie Luna, DOB Aug 01, 1976, MRN [id number]  PCP:  [name], MD  Cardiologist:  [name], MD  Electrophysiologist:  None   Referring MD: [name], MD   No chief complaint on file.   History of Present Illness:    Connie Luna is a 44 y.o. female with a hx of hypertension, hyperlipidemia, diabetes who presents for follow-up.  She was initially seen as a ED follow-up for chest pain on 02/28/2020.  Recent Covid infection in January.  Reports she woke up with chest pain around 3 AM on 3/30.  Described as sharp stabbing pain on left side of chest.  Lasted a few seconds and resolved, would occur repeatedly every few minutes for about 30 minutes.  She also describes a sensation that something is sitting in her chest, describes as feeling like food being stuck, it has been continuous for 1 week.  Denies any shortness of breath.  She typically walks for 20 minutes 2-3 times per week, denies any exertional chest pain or dyspnea.  She has been on losartan for hypertension, but was only taking about once per week.  No smoking history.  No history of heart disease in her immediate family.  Work-up in the ED included D-dimer negative, troponin 24 -> 22.  Lexiscan Myoview on 03/19/2020 showed small fixed defect at the apex, no evidence of ischemia (though no poor quality study with low cardiac counts compared to extracardiac activity), EF 31%.  TTE on 04/02/2020 showed basal inferior/inferoseptal akinesis with LVEF 35 to 40%, grade 2 diastolic dysfunction, moderate LV dilatation, no significant valvular disease, IVC small/collapsible.  RHC/LHC on 04/24/2020 showed normal coronary arteries, RA 7, RV 46/7, PA 36/15/26, PWCP 22, LVEDP 32, CI 4.5.    Since last clinic visit,   she reports that she continues to have chest pain, occurs about 1-2 times a week.  States that it feels improved from prior.  She denies any shortness of breath.   Does report she has been taking 800 mg of ibuprofen daily for back/ shoulder pain.   Wt Readings from Last 3 Encounters:  05/20/20 255 lb (115.7 kg)  05/06/20 251 lb 6.4 oz (114 kg)  04/24/20 250 lb 14.1 oz (113.8 kg)      Past Medical History:  Diagnosis Date  . Diabetes mellitus without complication (HCC)   . Hypertension     Past Surgical History:  Procedure Laterality Date  . RIGHT/LEFT HEART CATH AND CORONARY ANGIOGRAPHY N/A 04/24/2020   Procedure: RIGHT/LEFT HEART CATH AND CORONARY ANGIOGRAPHY;  Surgeon: [name], MD;  Location: [facility] INVASIVE CV LAB;  Service: Cardiovascular;  Laterality: N/A;    Current Medications: No outpatient medications have been marked as taking for the 07/29/20 encounter (Appointment) with [name], MD.   Current Facility-Administered Medications for the 07/29/20 encounter (Appointment) with [name], MD  Medication  . sodium chloride flush (NS) 0.9 % injection 3 mL     Allergies:   Patient has no known allergies.   Social History   Socioeconomic History  . Marital status: Married    Spouse name: Not on file  . Number of children: Not on file  . Years of education: Not on file  . Highest education level: Not on file  Occupational History  . Not on file  Tobacco Use  . Smoking status: Never Smoker  . Smokeless tobacco: Never Used  Substance and Sexual Activity  .  Alcohol use: Never  . Drug use: Never  . Sexual activity: Never  Other Topics Concern  . Not on file  Social History Narrative  . Not on file   Social Determinants of Health   Financial Resource Strain:   . Difficulty of Paying Living Expenses: Not on file  Food Insecurity:   . Worried About Programme researcher, broadcasting/film/video in the Last Year: Not on file  . Ran Out of Food in the Last Year: Not on file  Transportation Needs:   . Lack of Transportation (Medical): Not on file  . Lack of Transportation (Non-Medical): Not on file  Physical Activity:    . Days of Exercise per Week: Not on file  . Minutes of Exercise per Session: Not on file  Stress:   . Feeling of Stress : Not on file  Social Connections:   . Frequency of Communication with Friends and Family: Not on file  . Frequency of Social Gatherings with Friends and Family: Not on file  . Attends Religious Services: Not on file  . Active Member of Clubs or Organizations: Not on file  . Attends Banker Meetings: Not on file  . Marital Status: Not on file     Family History: The patient's family history is negative for Breast cancer.  ROS:   Please see the history of present illness.     All other systems reviewed and are negative.  EKGs/Labs/Other Studies Reviewed:    The following studies were reviewed today:   EKG:  EKG is ordered today.  The ekg ordered demonstrates normal sinus rhythm, rate 95, no ST abnormalities  Recent Labs: 04/19/2020: Platelets 245; TSH 1.790 04/24/2020: Hemoglobin 10.5 05/06/2020: Magnesium 1.8 07/02/2020: BUN 11; Creatinine, Ser 0.82; Potassium 4.3; Sodium 136  Recent Lipid Panel    Component Value Date/Time   CHOL 164 04/19/2020 1049   TRIG 76 04/19/2020 1049   HDL 59 04/19/2020 1049   CHOLHDL 2.8 04/19/2020 1049   LDLCALC 91 04/19/2020 1049    Physical Exam:    VS:  There were no vitals taken for this visit.    Wt Readings from Last 3 Encounters:  05/20/20 255 lb (115.7 kg)  05/06/20 251 lb 6.4 oz (114 kg)  04/24/20 250 lb 14.1 oz (113.8 kg)     GEN:  in no acute distress HEENT: Normal NECK: No JVD LYMPHATICS: No lymphadenopathy CARDIAC:RRR, no murmurs, rubs, gallops RESPIRATORY:  Clear to auscultation without rales, wheezing or rhonchi  ABDOMEN: Soft, non-tender, non-distended MUSCULOSKELETAL:  No edema; No deformity  SKIN: Warm and dry NEUROLOGIC:  Alert and oriented x 3 PSYCHIATRIC:  Normal affect   ASSESSMENT:    No diagnosis found. PLAN:     Acute combined systolic and diastolic heart failure:   Lexiscan Myoview on 03/19/2020 showed small fixed defect at the apex, no evidence of ischemia but was poor quality study with low cardiac counts compared to extracardiac activity.  EF 31%.  TTE on 04/02/2020 showed basal inferior/inferoseptal akinesis with LVEF 35 to 40%, grade 2 diastolic dysfunction, moderate LV dilatation, no significant valvular disease, IVC small/collapsible.  RHC/LHC on 04/24/2020 showed normal coronary arteries, RA 7, RV 46/7, PA 36/15/26, PWCP 22, LVEDP 32, CI 4.5. -Continue Entresto 24-26 mg twice daily -Continue toprol XL 150 mg daily -Continue spironolactone 12.5 mg daily -Continue Lasix 40 mg daily -Referred to endocrinology for diabetes, recommend starting SGLT2 inhibitor -Follow-up in pharmacy clinic in 2 weeks to continue to titrate heart failure medications -Has been  taking 800 mg of ibuprofen daily for back/shoulder pain.  Recommended avoiding NSAIDs given systolic heart failure and instead using Tylenol for pain. -Cardiac MRI to evaluate etiology of nonischemic cardiomyopathy  Hypertension: On Entresto 24-26 mg twice daily, Toprol-XL 25 mg daily, spironolactone 12.5 mg daily.  Appears well controlled  Type 2 diabetes: On Lantus.  A1c 9.1.  Recommend starting SGLT2 inhibitor.  Will refer to endocrinology.  Hyperlipidemia: Continue atorvastatin 40 mg daily.  LDL 91  Anemia: Hemoglobin 10.4 on 04/19/2020.   Iron studies consistent with iron deficiency anemia, started on iron supplementation.  Reports heavy menstrual bleeding, otherwise denies any bleeding issues  RTC in***  Medication Adjustments/Labs and Tests Ordered: Current medicines are reviewed at length with the patient today.  Concerns regarding medicines are outlined above.  No orders of the defined types were placed in this encounter.  No orders of the defined types were placed in this encounter.   There are no Patient Instructions on file for this visit.   Signed, [name], MD    07/28/2020 2:06 PM    [facility]

## 2020-07-29 ENCOUNTER — Other Ambulatory Visit: Payer: Self-pay

## 2020-07-29 ENCOUNTER — Ambulatory Visit: Payer: Medicaid Other | Admitting: Cardiology

## 2020-08-08 ENCOUNTER — Ambulatory Visit: Payer: Medicaid Other | Admitting: "Endocrinology

## 2020-08-12 ENCOUNTER — Telehealth (HOSPITAL_COMMUNITY): Payer: Self-pay | Admitting: Emergency Medicine

## 2020-08-12 NOTE — Telephone Encounter (Signed)
Reaching out to patient to offer assistance regarding upcoming cardiac imaging study; pt verbalizes understanding of appt date/time, parking situation and where to check in, pre-test NPO status and medications ordered, and verified current allergies; name and call back number provided for further questions should they arise Fawna Cranmer RN Navigator Cardiac Imaging [facility] [phone] office [phone] cell  Denies claustro, denies implants 

## 2020-08-13 ENCOUNTER — Other Ambulatory Visit: Payer: Self-pay

## 2020-08-13 ENCOUNTER — Ambulatory Visit (HOSPITAL_COMMUNITY)
Admission: RE | Admit: 2020-08-13 | Discharge: 2020-08-13 | Disposition: A | Discharge status: Home / Self Care | Payer: Medicaid Other | Source: Ambulatory Visit | Attending: Cardiology | Admitting: Cardiology

## 2020-08-13 ENCOUNTER — Ambulatory Visit (INDEPENDENT_AMBULATORY_CARE_PROVIDER_SITE_OTHER): Payer: Medicaid Other | Admitting: Pharmacist

## 2020-08-13 VITALS — BP 110/70 | HR 63 | Wt 252.2 lb

## 2020-08-13 DIAGNOSIS — I429 Cardiomyopathy, unspecified: Secondary | ICD-10-CM | POA: Insufficient documentation

## 2020-08-13 DIAGNOSIS — I420 Dilated cardiomyopathy: Secondary | ICD-10-CM

## 2020-08-13 DIAGNOSIS — I5041 Acute combined systolic (congestive) and diastolic (congestive) heart failure: Secondary | ICD-10-CM

## 2020-08-13 MED ORDER — GADOBUTROL 1 MMOL/ML IV SOLN
10.0000 mL | Freq: Once | INTRAVENOUS | Status: AC | PRN
  Administered 2020-08-13: 10 mL via INTRAVENOUS

## 2020-08-13 NOTE — Patient Instructions (Addendum)
It was good seeing you today!  We want to keep your blood pressure below 130/80 so please begin monitoring at home.  I will call you on Friday to check on your results  We will continue your Entresto 24-26 twice daily, your metoprolol succinate 150 mg once a day and your spironolactone 15 mg (1/2 tablet once a day)  You can discontinue your furosemide but please begin to weight yourself daily and let us know if you gain more than 3 pounds overnight or 5 pounds in a week  Continue your positive diet changes and exercise!  Laural Golden, PharmD, BCACP, CDCES [facility] [address] [place], [place] [address] Phone: [phone]; Fax: [phone] 08/13/2020 10:19 AM

## 2020-08-13 NOTE — Progress Notes (Signed)
Patient ID: Connie Luna                 DOB: 03-17-1976                      MRN: [id number]     HPI: Connie Luna is a 44 y.o. female referred by Dr. Bjorn Pippin for CHF management. PMH is significant for CHF, HTN, HLD, and DM.  Connie Luna is a 44 y.o. female referred by Dr. Bjorn Pippin to pharmacist managed CHF clinic. PMH is significant for CHF, HTN, HLD and DM.  EF on myoview on 03/19/20 was 31%.  TTE on 04/02/20 showed EF 35-40%.  At last visit with Dr. Bjorn Pippin on 05/06/20, patient complained about occasional chest pain but denied SOB.  Plan was to check BMET and if renal function and K normal, switch to [facility] and recheck BMP in 1 week.  Patient was prescribed Entresto but has not had updated BMP drawn.  Was able to pick up Entresto on 05/18/20 and started that day and d/c losartan.    DM not controlled, last A1c 9.1%.  Only on Lantus 60 units nightly.  Reports fasting blood sugars between 180-200. Takes atorvastatin 40mg  for lipid control.  Previous LDL 91 on 04/19/20.  Has BP cuff at home but is not using it.  Is exercising at least 30 minutes a day at least 4 times a week.  At last visit with PharmD clinic,metorpolol succinate was increased to 150 mg daily.  Unable to titrate up Entresto at that time because patient was not checking BP at home.  Patient presents today to clinic for follow up. She is tolerating medications well and is compliant.  However she is still not checking her BP at home and not weighing herself.  Continues to walk daily and has cut down on fast foods and fried food. No swelling, SOB, dizziness or lightheadedness.   Reports frequent urination and occasional chest tightness.  She believes the chest tightness may be related to stress.  Had one episode at night where she was wheezing but does not know if it was cardiac related or allergy related.  Is scheduled for cardiac imaging today  Current HTN meds: furosemide 40 mg daily, metoprolol succinate 150mg  daily,  Entresto 24-26 BID, spironolactone 12.5 mg daily Previously tried: losartan 25 mg BP goal: <130/80  Family History: Both grandmothers had heart disease  Social History: no smoking history, very occasional EtOH   Diet: Drinks water, diet soda. Eats a lot of fried food.  Eats out twice a week at fast food restaurants.  Dinner is salmon, salads, asparagus, green beans, broccoli, chicken (fried/rotisserie), 04/21/20 burgers  Exercise:  Started exercising in April.  20-25 minutes of exercise (walking) daily+ squats  Home BP readings: has not been checking BP at home  Wt Readings from Last 3 Encounters:  05/20/20 255 lb (115.7 kg)  05/06/20 251 lb 6.4 oz (114 kg)  04/24/20 250 lb 14.1 oz (113.8 kg)   BP Readings from Last 3 Encounters:  07/02/20 110/72  05/20/20 102/70  05/06/20 120/70   Pulse Readings from Last 3 Encounters:  07/02/20 80  05/20/20 87  05/06/20 95    Renal function: CrCl cannot be calculated (Patient's most recent lab result is older than the maximum 21 days allowed.).  Past Medical History:  Diagnosis Date  . Diabetes mellitus without complication (HCC)   . Hypertension     Current Outpatient Medications on File Prior to Visit  Medication  Sig Dispense Refill  . albuterol (VENTOLIN HFA) 108 (90 Base) MCG/ACT inhaler Inhale 2 puffs into the lungs every 6 (six) hours as needed.    . APPLE CIDER VINEGAR PO Take 2 tablets by mouth in the morning and at bedtime. Goli    . aspirin EC 81 MG tablet Take 81 mg by mouth at bedtime.    Marland Kitchen atorvastatin (LIPITOR) 40 MG tablet Take 40 mg by mouth at bedtime.    . ferrous gluconate (FERGON) 324 MG tablet Take 1 tablet (324 mg total) by mouth daily with breakfast. 90 tablet 3  . fexofenadine (ALLEGRA) 180 MG tablet Take 180 mg by mouth daily.    . fluticasone (FLONASE) 50 MCG/ACT nasal spray Place 2 sprays into both nostrils in the morning and at bedtime.    . furosemide (LASIX) 40 MG tablet TAKE 1 TABLET BY MOUTH EVERY DAY 90  tablet 1  . insulin glargine (LANTUS) 100 UNIT/ML Solostar Pen Inject 60 Units into the skin at bedtime.    . metoprolol succinate (TOPROL-XL) 100 MG 24 hr tablet Take 1.5 tablets (150 mg total) by mouth daily. Take with or immediately following a meal. 135 tablet 3  . omeprazole (PRILOSEC) 20 MG capsule Take 1 capsule (20 mg total) by mouth daily. (Patient taking differently: Take 20 mg by mouth daily as needed (Reflux). ) 1 capsule 14  . sacubitril-valsartan (ENTRESTO) 24-26 MG Take 1 tablet by mouth 2 (two) times daily. 60 tablet 5  . spironolactone (ALDACTONE) 25 MG tablet TAKE 1/2 TABLET BY MOUTH EVERY DAY 45 tablet 9  . tetrahydrozoline-zinc (VISINE-AC) 0.05-0.25 % ophthalmic solution Place 2 drops into both eyes 2 (two) times daily as needed (Dry Eyes).     Current Facility-Administered Medications on File Prior to Visit  Medication Dose Route Frequency Provider Last Rate Last Admin  . sodium chloride flush (NS) 0.9 % injection 3 mL  3 mL Intravenous Q12H [name], MD        No Known Allergies   Assessment/Plan:  1. Hypertension - Patient BP today 110/70 which is at goal of <130/80.  Patient BP has consistently been at goal while in office, however she has not checked it while at home.  Has also stopped weighing herself.  Instructed patient to begin checking BP daily 1-2 hours after taking medications and to begin weighing herself daily.  Instructed patient to call if she gains 3# overnight or 5# in a week.    Since patient is tolerating Entresto 24-26mg  with normal BP, will discontinue furosemide at this time However, may need to be restarted if patient experiences swelling/weight gain.  This should help her frequent urination.     Will contact patient on 9/17 and check on BP readings and consider Entresto increase at that time.  If titrating Entresto, will recheck blood work 1 week after.  Will recheck in office in 1 month.  Connie Luna, PharmD, BCACP, CDCES [facility]  [facility] [address], [place], [place] [address] Phone: [phone]; Fax: [phone] 08/13/2020 11:03 AM

## 2020-08-16 ENCOUNTER — Telehealth: Payer: Self-pay | Admitting: Pharmacist

## 2020-08-16 NOTE — Telephone Encounter (Signed)
Attempted to call patient to check home BP readings and possible Entresto titration.  No answer and VM is full

## 2020-08-22 NOTE — Telephone Encounter (Signed)
Attempted to call patient again.  No answer and voicemail box full

## 2020-08-27 NOTE — Telephone Encounter (Signed)
Left message on machine.

## 2020-09-03 ENCOUNTER — Encounter: Payer: Self-pay | Admitting: "Endocrinology

## 2020-09-03 ENCOUNTER — Ambulatory Visit (INDEPENDENT_AMBULATORY_CARE_PROVIDER_SITE_OTHER): Payer: Medicaid Other | Admitting: "Endocrinology

## 2020-09-03 ENCOUNTER — Other Ambulatory Visit: Payer: Self-pay

## 2020-09-03 VITALS — BP 135/85 | HR 74 | Ht 64.0 in | Wt 257.0 lb

## 2020-09-03 DIAGNOSIS — E782 Mixed hyperlipidemia: Secondary | ICD-10-CM | POA: Insufficient documentation

## 2020-09-03 DIAGNOSIS — I10 Essential (primary) hypertension: Secondary | ICD-10-CM | POA: Diagnosis not present

## 2020-09-03 DIAGNOSIS — E1110 Type 2 diabetes mellitus with ketoacidosis without coma: Secondary | ICD-10-CM | POA: Insufficient documentation

## 2020-09-03 LAB — POCT GLYCOSYLATED HEMOGLOBIN (HGB A1C): Hemoglobin A1C: 10.1 % — AB (ref 4.0–5.6)

## 2020-09-03 MED ORDER — METFORMIN HCL ER 500 MG PO TB24: 500.0000 mg | ORAL_TABLET | Freq: Every day | ORAL | 3 refills | Status: DC

## 2020-09-03 NOTE — Patient Instructions (Signed)

## 2020-09-03 NOTE — Progress Notes (Signed)
Endocrinology Consult Note       09/03/2020, 1:54 PM   Subjective:    Patient ID: Connie Luna, female    DOB: August 25, 1976.  Mairim Bade is being seen in consultation for management of currently uncontrolled symptomatic diabetes requested by  [name], MD.   Past Medical History:  Diagnosis Date  . Diabetes mellitus without complication (HCC)   . Hypertension     Past Surgical History:  Procedure Laterality Date  . RIGHT/LEFT HEART CATH AND CORONARY ANGIOGRAPHY N/A 04/24/2020   Procedure: RIGHT/LEFT HEART CATH AND CORONARY ANGIOGRAPHY;  Surgeon: [name], MD;  Location: [facility] INVASIVE CV LAB;  Service: Cardiovascular;  Laterality: N/A;    Social History   Socioeconomic History  . Marital status: Married    Spouse name: Not on file  . Number of children: Not on file  . Years of education: Not on file  . Highest education level: Not on file  Occupational History  . Not on file  Tobacco Use  . Smoking status: Never Smoker  . Smokeless tobacco: Never Used  Vaping Use  . Vaping Use: Never used  Substance and Sexual Activity  . Alcohol use: Never  . Drug use: Never  . Sexual activity: Never  Other Topics Concern  . Not on file  Social History Narrative  . Not on file   Social Determinants of Health   Financial Resource Strain:   . Difficulty of Paying Living Expenses: Not on file  Food Insecurity:   . Worried About Programme researcher, broadcasting/film/video in the Last Year: Not on file  . Ran Out of Food in the Last Year: Not on file  Transportation Needs:   . Lack of Transportation (Medical): Not on file  . Lack of Transportation (Non-Medical): Not on file  Physical Activity:   . Days of Exercise per Week: Not on file  . Minutes of Exercise per Session: Not on file  Stress:   . Feeling of Stress : Not on file  Social Connections:   . Frequency of Communication with Friends and Family:  Not on file  . Frequency of Social Gatherings with Friends and Family: Not on file  . Attends Religious Services: Not on file  . Active Member of Clubs or Organizations: Not on file  . Attends Banker Meetings: Not on file  . Marital Status: Not on file    Family History  Problem Relation Age of Onset  . Hypertension Mother   . Hyperlipidemia Mother   . Diabetes Father   . Diabetes Paternal Aunt   . Diabetes Paternal Uncle   . Diabetes Paternal Grandmother   . Diabetes Paternal Grandfather   . Breast cancer Neg Hx     Outpatient Encounter Medications as of 09/03/2020  Medication Sig  . albuterol (VENTOLIN HFA) 108 (90 Base) MCG/ACT inhaler Inhale 2 puffs into the lungs every 6 (six) hours as needed.  . APPLE CIDER VINEGAR PO Take 2 tablets by mouth in the morning and at bedtime. Goli  . aspirin EC 81 MG tablet Take 81 mg by mouth at bedtime.  Marland Kitchen atorvastatin (LIPITOR) 40  MG tablet Take 40 mg by mouth at bedtime.  . ferrous gluconate (FERGON) 324 MG tablet Take 1 tablet (324 mg total) by mouth daily with breakfast.  . fexofenadine (ALLEGRA) 180 MG tablet Take 180 mg by mouth daily.  . fluticasone (FLONASE) 50 MCG/ACT nasal spray Place 2 sprays into both nostrils in the morning and at bedtime.  . insulin glargine (LANTUS) 100 UNIT/ML Solostar Pen Inject 60 Units into the skin at bedtime.  . metFORMIN (GLUCOPHAGE XR) 500 MG 24 hr tablet Take 1 tablet (500 mg total) by mouth daily with breakfast.  . metoprolol succinate (TOPROL-XL) 100 MG 24 hr tablet Take 1.5 tablets (150 mg total) by mouth daily. Take with or immediately following a meal.  . omeprazole (PRILOSEC) 20 MG capsule Take 1 capsule (20 mg total) by mouth daily. (Patient taking differently: Take 20 mg by mouth daily as needed (Reflux). )  . sacubitril-valsartan (ENTRESTO) 24-26 MG Take 1 tablet by mouth 2 (two) times daily.  Marland Kitchen. spironolactone (ALDACTONE) 25 MG tablet TAKE 1/2 TABLET BY MOUTH EVERY DAY  .  tetrahydrozoline-zinc (VISINE-AC) 0.05-0.25 % ophthalmic solution Place 2 drops into both eyes 2 (two) times daily as needed (Dry Eyes).   Facility-Administered Encounter Medications as of 09/03/2020  Medication  . sodium chloride flush (NS) 0.9 % injection 3 mL    ALLERGIES: No Known Allergies  VACCINATION STATUS: Immunization History  Administered Date(s) Administered  . Moderna SARS-COVID-2 Vaccination 03/02/2020, 03/30/2020    Diabetes She presents for her initial diabetic visit. She has type 2 diabetes mellitus. Onset time: She was diagnosed at approximate age of 34 years. She did have history of gestational diabetes when she was [age]. Her disease course has been worsening. There are no hypoglycemic associated symptoms. Pertinent negatives for hypoglycemia include no confusion, headaches, pallor or seizures. Associated symptoms include blurred vision, fatigue, polydipsia and polyuria. Pertinent negatives for diabetes include no chest pain and no polyphagia. There are no hypoglycemic complications. Symptoms are worsening. There are no diabetic complications. Risk factors for coronary artery disease include dyslipidemia, diabetes mellitus, family history, obesity, hypertension and sedentary lifestyle. Current diabetic treatments: She is on Lantus 60 units nightly. Her weight is increasing steadily. She is following a generally unhealthy diet. When asked about meal planning, she reported none. She has not had a previous visit with a dietitian. She rarely participates in exercise. (She did not bring any logs nor meter to review today. Her point-of-care A1c is 10.1% increasing from 9.1% in May 2021.) An ACE inhibitor/angiotensin II receptor blocker is being taken.  Hyperlipidemia This is a chronic problem. The current episode started more than 1 year ago. The problem is controlled. Exacerbating diseases include diabetes and obesity. Pertinent negatives include no chest pain, myalgias or shortness of  breath. Current antihyperlipidemic treatment includes statins. Risk factors for coronary artery disease include diabetes mellitus, dyslipidemia, family history, hypertension, obesity and a sedentary lifestyle.  Hypertension This is a chronic problem. The current episode started more than 1 year ago. Associated symptoms include blurred vision. Pertinent negatives include no chest pain, headaches, palpitations or shortness of breath. Risk factors for coronary artery disease include diabetes mellitus, dyslipidemia, obesity, sedentary lifestyle and family history. Past treatments include angiotensin blockers and beta blockers.    Review of Systems  Constitutional: Positive for fatigue. Negative for chills, fever and unexpected weight change.  HENT: Negative for trouble swallowing and voice change.   Eyes: Positive for blurred vision. Negative for visual disturbance.  Respiratory: Negative for cough,  shortness of breath and wheezing.   Cardiovascular: Negative for chest pain, palpitations and leg swelling.  Gastrointestinal: Negative for diarrhea, nausea and vomiting.  Endocrine: Positive for polydipsia and polyuria. Negative for cold intolerance, heat intolerance and polyphagia.  Musculoskeletal: Negative for arthralgias and myalgias.  Skin: Negative for color change, pallor, rash and wound.  Neurological: Negative for seizures and headaches.  Psychiatric/Behavioral: Negative for confusion and suicidal ideas.    Objective:    Vitals with BMI 09/03/2020 08/13/2020 07/02/2020  Height 5\' 4"  - -  Weight 257 lbs 252 lbs 3 oz -  BMI 44.09 - -  Systolic 135 110  Diastolic 85 70 72  Pulse 74 63 80    BP 135/85   Pulse 74   Ht 5\' 4"  (1.626 m)   Wt 257 lb (116.6 kg)   BMI 44.11 kg/m   Wt Readings from Last 3 Encounters:  09/03/20 257 lb (116.6 kg)  08/13/20 252 lb 3.2 oz (114.4 kg)  05/20/20 255 lb (115.7 kg)     Physical Exam Constitutional:      Appearance: She is well-developed.   HENT:     Head: Normocephalic and atraumatic.  Neck:     Thyroid: No thyromegaly.     Trachea: No tracheal deviation.  Cardiovascular:     Rate and Rhythm: Normal rate and regular rhythm.  Pulmonary:     Effort: Pulmonary effort is normal.     Breath sounds: Normal breath sounds.  Abdominal:     General: Bowel sounds are normal.     Palpations: Abdomen is soft.     Tenderness: There is no abdominal tenderness. There is no guarding.  Musculoskeletal:        General: Normal range of motion.     Cervical back: Normal range of motion and neck supple.  Skin:    General: Skin is warm and dry.     Coloration: Skin is not pale.     Findings: No erythema or rash.  Neurological:     Mental Status: She is alert and oriented to person, place, and time.     Cranial Nerves: No cranial nerve deficit.     Coordination: Coordination normal.     Deep Tendon Reflexes: Reflexes are normal and symmetric.  Psychiatric:        Judgment: Judgment normal.       CMP ( most recent) CMP     Component Value Date/Time   NA 136 07/02/2020 1539   K 4.3 07/02/2020 1539   CL 99 07/02/2020 1539   CO2 24 07/02/2020 1539   GLUCOSE 401 (H) 07/02/2020 1539   GLUCOSE 218 (H) 02/27/2020 0458   BUN 11 07/02/2020 1539   CREATININE 0.82 07/02/2020 1539   CALCIUM 9.4 07/02/2020 1539   GFRNONAA 87 07/02/2020 1539   GFRAA 100 07/02/2020 1539     Diabetic Labs (most recent): Lab Results  Component Value Date   HGBA1C 10.1 (A) 09/03/2020   HGBA1C 9.1 (H) 04/19/2020     Lipid Panel ( most recent) Lipid Panel     Component Value Date/Time   CHOL 164 04/19/2020 1049   TRIG 76 04/19/2020 1049   HDL 59 04/19/2020 1049   CHOLHDL 2.8 04/19/2020 1049   LDLCALC 91 04/19/2020 1049   LABVLDL 14 04/19/2020 1049      Lab Results  Component Value Date   TSH 1.790 04/19/2020      Assessment & Plan:   1. DM (diabetes mellitus) type 2, uncontrolled,  with ketoacidosis (HCC)   - Albena Comes has  currently uncontrolled symptomatic type 2 DM since  44 years of age,  with most recent A1c of 10.1 %. Recent labs reviewed. - I had a long discussion with her about the progressive nature of diabetes and the pathology behind its complications. -her diabetes is complicated by obesity/sedentary life and she remains at a high risk for more acute and chronic complications which include CAD, CVA, CKD, retinopathy, and neuropathy. These are all discussed in detail with her.  - I have counseled her on diet  and weight management  by adopting a carbohydrate restricted/protein rich diet. Patient is encouraged to switch to  unprocessed or minimally processed     complex starch and increased protein intake (animal or plant source), fruits, and vegetables. -  she is advised to stick to a routine mealtimes to eat 3 meals  a day and avoid unnecessary snacks ( to snack only to correct hypoglycemia).   - she admits that there is a room for improvement in her food and drink choices. - Suggestion is made for her to avoid simple carbohydrates  from her diet including Cakes, Sweet Desserts, Ice Cream, Soda (diet and regular), Sweet Tea, Candies, Chips, Cookies, Store Bought Juices, Alcohol in Excess of  1-2 drinks a day, Artificial Sweeteners,  Coffee Creamer, and "Sugar-free" Products. This will help patient to have more stable blood glucose profile and potentially avoid unintended weight gain.  - she will be scheduled with Norm Salt, RDN, CDE for diabetes education.  - I have approached her with the following individualized plan to manage  her diabetes and patient agrees:   -In light of her prevailing glycemic burden, she may need multiple daily injections of insulin in order for her to achieve and maintain control of diabetes to target. -In preparation, she is approached to start monitoring blood glucose 4 times a day-daily before meals and at bedtime and return in 1 week with her meter and logs. -In the  meantime, she is advised to continue Lantus 60 units nightly. - she is warned not to take insulin without proper monitoring per orders.  - she is encouraged to call clinic for blood glucose levels less than 70 or above 300 mg /dl. -She will benefit from Metformin treatment.  I discussed and prescribed Metformin 500 mg XR p.o. daily after breakfast. - she will be considered for incretin therapy as appropriate next visit.  - Specific targets for  A1c;  LDL, HDL,  and Triglycerides were discussed with the patient.  2) Blood Pressure /Hypertension:  her blood pressure is  controlled to target.   she is advised to continue her current medications including metoprolol 100 mg p.o. twice daily, Entresto 24-26 mg p.o. twice daily.  3) Lipids/Hyperlipidemia:   Review of her recent lipid panel showed uncontrolled  LDL at 91 .  she  is advised to continue    atorvastatin 40 mg daily at bedtime.  Side effects and precautions discussed with her.  4)  Weight/Diet:  Body mass index is 44.11 kg/m.  -   clearly complicating her diabetes care.   she is  a candidate for weight loss. I discussed with her the fact that loss of 5 - 10% of her  current body weight will have the most impact on her diabetes management.  Exercise, and detailed carbohydrates information provided  -  detailed on discharge instructions.  5) Chronic Care/Health Maintenance:  -she  is on ACEI/ARB and Statin  medications and  is encouraged to initiate and continue to follow up with Ophthalmology, Dentist,  Podiatrist at least yearly or according to recommendations, and advised to   stay away from smoking. I have recommended yearly flu vaccine and pneumonia vaccine at least every 5 years; moderate intensity exercise for up to 150 minutes weekly; and  sleep for at least 7 hours a day.  - she is  advised to maintain close follow up with [name], MD for primary care needs, as well as her other providers for optimal and coordinated care.   -  Time spent in this patient care: 60 min, of which > 50% was spent in  counseling  her about her chronically uncontrolled type 2 diabetes, hyperlipidemia, hypertension and the rest reviewing her blood glucose logs , discussing her hypoglycemia and hyperglycemia episodes, reviewing her current and  previous labs / studies  ( including abstraction from other facilities) and medications  doses and developing a  long term treatment plan based on the latest standards of care/ guidelines; and documenting her care.    Please refer to Patient Instructions for Blood Glucose Monitoring and Insulin/Medications Dosing Guide"  in media tab for additional information. Please  also refer to " Patient Self Inventory" in the Media  tab for reviewed elements of pertinent patient history.  Julianne Rice participated in the discussions, expressed understanding, and voiced agreement with the above plans.  All questions were answered to her satisfaction. she is encouraged to contact clinic should she have any questions or concerns prior to her return visit.   Follow up plan: - Return in about 1 week (around 09/10/2020) for F/U with Meter and Logs Only - no Labs, ABI in Office [place].  Marquis Lunch, MD [facility] [address] [place], [place] [address] Phone: [phone]  Fax: [phone]    09/03/2020, 1:54 PM  This note was partially dictated with voice recognition software. Similar sounding words can be transcribed inadequately or may not  be corrected upon review.

## 2020-09-10 ENCOUNTER — Ambulatory Visit: Payer: Medicaid Other

## 2020-09-10 NOTE — Progress Notes (Deleted)
Patient ID: Connie Luna                 DOB: [birth year]                      MRN: [id number]     HPI: Connie Luna is a 44 y.o. female referred by Dr. Bjorn Pippin to CHF clinic. PMH is significant for HTN, CHF, HLD, and DM.  Current CHF meds: metoprolol succinate 100mg  daily, Entresto 24/26 BID, spironolactone 12.5mg  daily Previously tried: furosemide 40 mg, hydralazine 10mg , losartan 100mg ,  BP goal: <130/80  Family History:   Social History:   Diet:   Exercise:   Home BP readings:   Wt Readings from Last 3 Encounters:  09/03/20 257 lb (116.6 kg)  08/13/20 252 lb 3.2 oz (114.4 kg)  05/20/20 255 lb (115.7 kg)   BP Readings from Last 3 Encounters:  09/03/20 135/85  08/13/20 110/70  07/02/20 110/72   Pulse Readings from Last 3 Encounters:  09/03/20 74  08/13/20 63  07/02/20 80    Renal function: CrCl cannot be calculated (Patient's most recent lab result is older than the maximum 21 days allowed.).  Past Medical History:  Diagnosis Date  . Diabetes mellitus without complication (HCC)   . Hypertension     Current Outpatient Medications on File Prior to Visit  Medication Sig Dispense Refill  . albuterol (VENTOLIN HFA) 108 (90 Base) MCG/ACT inhaler Inhale 2 puffs into the lungs every 6 (six) hours as needed.    . APPLE CIDER VINEGAR PO Take 2 tablets by mouth in the morning and at bedtime. Goli    . aspirin EC 81 MG tablet Take 81 mg by mouth at bedtime.    11/03/20 atorvastatin (LIPITOR) 40 MG tablet Take 40 mg by mouth at bedtime.    . ferrous gluconate (FERGON) 324 MG tablet Take 1 tablet (324 mg total) by mouth daily with breakfast. 90 tablet 3  . fexofenadine (ALLEGRA) 180 MG tablet Take 180 mg by mouth daily.    . fluticasone (FLONASE) 50 MCG/ACT nasal spray Place 2 sprays into both nostrils in the morning and at bedtime.    . insulin glargine (LANTUS) 100 UNIT/ML Solostar Pen Inject 60 Units into the skin at bedtime.    . metFORMIN (GLUCOPHAGE XR) 500 MG 24 hr  tablet Take 1 tablet (500 mg total) by mouth daily with breakfast. 30 tablet 3  . metoprolol succinate (TOPROL-XL) 100 MG 24 hr tablet Take 1.5 tablets (150 mg total) by mouth daily. Take with or immediately following a meal. 135 tablet 3  . omeprazole (PRILOSEC) 20 MG capsule Take 1 capsule (20 mg total) by mouth daily. (Patient taking differently: Take 20 mg by mouth daily as needed (Reflux). ) 1 capsule 14  . sacubitril-valsartan (ENTRESTO) 24-26 MG Take 1 tablet by mouth 2 (two) times daily. 60 tablet 5  . spironolactone (ALDACTONE) 25 MG tablet TAKE 1/2 TABLET BY MOUTH EVERY DAY 45 tablet 9  . tetrahydrozoline-zinc (VISINE-AC) 0.05-0.25 % ophthalmic solution Place 2 drops into both eyes 2 (two) times daily as needed (Dry Eyes).     Current Facility-Administered Medications on File Prior to Visit  Medication Dose Route Frequency Provider Last Rate Last Admin  . sodium chloride flush (NS) 0.9 % injection 3 mL  3 mL Intravenous Q12H 09/01/20, MD        No Known Allergies   Assessment/Plan:  1. CHF  -

## 2020-09-11 ENCOUNTER — Encounter: Payer: Self-pay | Admitting: "Endocrinology

## 2020-09-11 ENCOUNTER — Ambulatory Visit (INDEPENDENT_AMBULATORY_CARE_PROVIDER_SITE_OTHER): Payer: Medicaid Other | Admitting: "Endocrinology

## 2020-09-11 ENCOUNTER — Other Ambulatory Visit: Payer: Self-pay

## 2020-09-11 VITALS — BP 124/72 | HR 72 | Ht 64.0 in | Wt 259.2 lb

## 2020-09-11 DIAGNOSIS — E782 Mixed hyperlipidemia: Secondary | ICD-10-CM

## 2020-09-11 DIAGNOSIS — I10 Essential (primary) hypertension: Secondary | ICD-10-CM

## 2020-09-11 DIAGNOSIS — E1110 Type 2 diabetes mellitus with ketoacidosis without coma: Secondary | ICD-10-CM

## 2020-09-11 NOTE — Patient Instructions (Signed)

## 2020-09-11 NOTE — Progress Notes (Signed)
09/11/2020, 6:03 PM  Endocrinology follow-up note   Subjective:    Patient ID: Connie Luna, female    DOB: December 01, 1975.  Connie Luna is being seen in follow-up after she was seen in consultation for management of currently uncontrolled symptomatic diabetes requested by  [name], MD.   Past Medical History:  Diagnosis Date  . Diabetes mellitus without complication (HCC)   . Hypertension     Past Surgical History:  Procedure Laterality Date  . RIGHT/LEFT HEART CATH AND CORONARY ANGIOGRAPHY N/A 04/24/2020   Procedure: RIGHT/LEFT HEART CATH AND CORONARY ANGIOGRAPHY;  Surgeon: [name], MD;  Location: [facility] INVASIVE CV LAB;  Service: Cardiovascular;  Laterality: N/A;    Social History   Socioeconomic History  . Marital status: Married    Spouse name: Not on file  . Number of children: Not on file  . Years of education: Not on file  . Highest education level: Not on file  Occupational History  . Not on file  Tobacco Use  . Smoking status: Never Smoker  . Smokeless tobacco: Never Used  Vaping Use  . Vaping Use: Never used  Substance and Sexual Activity  . Alcohol use: Never  . Drug use: Never  . Sexual activity: Never  Other Topics Concern  . Not on file  Social History Narrative  . Not on file   Social Determinants of Health   Financial Resource Strain:   . Difficulty of Paying Living Expenses: Not on file  Food Insecurity:   . Worried About Programme researcher, broadcasting/film/video in the Last Year: Not on file  . Ran Out of Food in the Last Year: Not on file  Transportation Needs:   . Lack of Transportation (Medical): Not on file  . Lack of Transportation (Non-Medical): Not on file  Physical Activity:   . Days of Exercise per Week: Not on file  . Minutes of Exercise per Session: Not on file  Stress:   . Feeling of Stress : Not on file  Social Connections:   . Frequency of Communication with Friends and Family: Not on file  . Frequency  of Social Gatherings with Friends and Family: Not on file  . Attends Religious Services: Not on file  . Active Member of Clubs or Organizations: Not on file  . Attends Banker Meetings: Not on file  . Marital Status: Not on file    Family History  Problem Relation Age of Onset  . Hypertension Mother   . Hyperlipidemia Mother   . Diabetes Father   . Diabetes Paternal Aunt   . Diabetes Paternal Uncle   . Diabetes Paternal Grandmother   . Diabetes Paternal Grandfather   . Breast cancer Neg Hx     Outpatient Encounter Medications as of 09/11/2020  Medication Sig  . albuterol (VENTOLIN HFA) 108 (90 Base) MCG/ACT inhaler Inhale 2 puffs into the lungs every 6 (six) hours as needed.  . APPLE CIDER VINEGAR PO Take 2 tablets by mouth in the morning and at bedtime. Goli  . aspirin EC 81 MG tablet Take 81 mg by mouth at bedtime.  Marland Kitchen atorvastatin (LIPITOR) 40 MG tablet Take 40 mg by mouth at bedtime.  . fexofenadine (ALLEGRA) 180 MG tablet Take 180 mg by mouth daily.  . fluticasone (FLONASE) 50 MCG/ACT nasal spray Place 2 sprays into both nostrils in the morning and at bedtime.  . insulin glargine (LANTUS) 100 UNIT/ML Solostar Pen Inject 60 Units  into the skin at bedtime.  . metFORMIN (GLUCOPHAGE XR) 500 MG 24 hr tablet Take 1 tablet (500 mg total) by mouth daily with breakfast.  . metoprolol succinate (TOPROL-XL) 100 MG 24 hr tablet Take 1.5 tablets (150 mg total) by mouth daily. Take with or immediately following a meal.  . omeprazole (PRILOSEC) 20 MG capsule Take 1 capsule (20 mg total) by mouth daily. (Patient taking differently: Take 20 mg by mouth daily as needed (Reflux). )  . sacubitril-valsartan (ENTRESTO) 24-26 MG Take 1 tablet by mouth 2 (two) times daily.  Marland Kitchen spironolactone (ALDACTONE) 25 MG tablet TAKE 1/2 TABLET BY MOUTH EVERY DAY  . tetrahydrozoline-zinc (VISINE-AC) 0.05-0.25 % ophthalmic solution Place 2 drops into both eyes 2 (two) times daily as needed (Dry Eyes).  .  [DISCONTINUED] ferrous gluconate (FERGON) 324 MG tablet Take 1 tablet (324 mg total) by mouth daily with breakfast.   Facility-Administered Encounter Medications as of 09/11/2020  Medication  . sodium chloride flush (NS) 0.9 % injection 3 mL    ALLERGIES: No Known Allergies  VACCINATION STATUS: Immunization History  Administered Date(s) Administered  . Moderna SARS-COVID-2 Vaccination 03/02/2020, 03/30/2020    Diabetes She presents for her follow-up diabetic visit. She has type 2 diabetes mellitus. Onset time: She was diagnosed at approximate age of 34 years. She did have history of gestational diabetes when she was 54. Her disease course has been improving. There are no hypoglycemic associated symptoms. Pertinent negatives for hypoglycemia include no confusion, headaches, pallor or seizures. Associated symptoms include blurred vision and fatigue. Pertinent negatives for diabetes include no chest pain, no polydipsia, no polyphagia and no polyuria. There are no hypoglycemic complications. Symptoms are improving. There are no diabetic complications. Risk factors for coronary artery disease include dyslipidemia, diabetes mellitus, family history, obesity, hypertension and sedentary lifestyle. Current diabetic treatments: She is on Lantus 60 units nightly. Her weight is stable. She is following a generally unhealthy diet. When asked about meal planning, she reported none. She has not had a previous visit with a dietitian. She rarely participates in exercise. Her home blood glucose trend is decreasing steadily. Her breakfast blood glucose range is generally 140-180 mg/dl. Her lunch blood glucose range is generally 140-180 mg/dl. Her dinner blood glucose range is generally 140-180 mg/dl. Her bedtime blood glucose range is generally 140-180 mg/dl. Her overall blood glucose range is 140-180 mg/dl. [name] presents with significant improvement in her glycemic profile to near target levels.  Her recent  point-of-care A1c was 10.1%.  She did not document any hypoglycemia.   ) An ACE inhibitor/angiotensin II receptor blocker is being taken.  Hyperlipidemia This is a chronic problem. The current episode started more than 1 year ago. The problem is controlled. Exacerbating diseases include diabetes and obesity. Pertinent negatives include no chest pain, myalgias or shortness of breath. Current antihyperlipidemic treatment includes statins. Risk factors for coronary artery disease include diabetes mellitus, dyslipidemia, family history, hypertension, obesity and a sedentary lifestyle.  Hypertension This is a chronic problem. The current episode started more than 1 year ago. Associated symptoms include blurred vision. Pertinent negatives include no chest pain, headaches, palpitations or shortness of breath. Risk factors for coronary artery disease include diabetes mellitus, dyslipidemia, obesity, sedentary lifestyle and family history. Past treatments include angiotensin blockers and beta blockers.    Review of Systems  Constitutional: Positive for fatigue. Negative for chills, fever and unexpected weight change.  HENT: Negative for trouble swallowing and voice change.   Eyes: Positive for blurred vision. Negative  for visual disturbance.  Respiratory: Negative for cough, shortness of breath and wheezing.   Cardiovascular: Negative for chest pain, palpitations and leg swelling.  Gastrointestinal: Negative for diarrhea, nausea and vomiting.  Endocrine: Negative for cold intolerance, heat intolerance, polydipsia, polyphagia and polyuria.  Musculoskeletal: Negative for arthralgias and myalgias.  Skin: Negative for color change, pallor, rash and wound.  Neurological: Negative for seizures and headaches.  Psychiatric/Behavioral: Negative for confusion and suicidal ideas.    Objective:    Vitals with BMI 09/11/2020 09/03/2020 08/13/2020  Height 5\' 4"  5\' 4"  -  Weight 259 lbs 3 oz 257 lbs 252 lbs 3 oz   BMI 44.47 44.09 -  Systolic 124 135 [id number]  Diastolic 72 85 70  Pulse 72 74 63    BP 124/72   Pulse 72   Ht 5\' 4"  (1.626 m)   Wt 259 lb 3.2 oz (117.6 kg)   BMI 44.49 kg/m   Wt Readings from Last 3 Encounters:  09/11/20 259 lb 3.2 oz (117.6 kg)  09/03/20 257 lb (116.6 kg)  08/13/20 252 lb 3.2 oz (114.4 kg)     Physical Exam Constitutional:      Appearance: She is well-developed.  HENT:     Head: Normocephalic and atraumatic.  Neck:     Thyroid: No thyromegaly.     Trachea: No tracheal deviation.  Cardiovascular:     Rate and Rhythm: Normal rate and regular rhythm.  Pulmonary:     Effort: Pulmonary effort is normal.     Breath sounds: Normal breath sounds.  Abdominal:     General: Bowel sounds are normal.     Palpations: Abdomen is soft.     Tenderness: There is no abdominal tenderness. There is no guarding.  Musculoskeletal:        General: Normal range of motion.     Cervical back: Normal range of motion and neck supple.  Skin:    General: Skin is warm and dry.     Coloration: Skin is not pale.     Findings: No erythema or rash.  Neurological:     Mental Status: She is alert and oriented to person, place, and time.     Cranial Nerves: No cranial nerve deficit.     Coordination: Coordination normal.     Deep Tendon Reflexes: Reflexes are normal and symmetric.  Psychiatric:        Judgment: Judgment normal.       CMP ( most recent) CMP     Component Value Date/Time   NA 136 07/02/2020 1539   K 4.3 07/02/2020 1539   CL 99 07/02/2020 1539   CO2 24 07/02/2020 1539   GLUCOSE 401 (H) 07/02/2020 1539   GLUCOSE 218 (H) 02/27/2020 0458   BUN 11 07/02/2020 1539   CREATININE 0.82 07/02/2020 1539   CALCIUM 9.4 07/02/2020 1539   GFRNONAA 87 07/02/2020 1539   GFRAA 100 07/02/2020 1539     Diabetic Labs (most recent): Lab Results  Component Value Date   HGBA1C 10.1 (A) 09/03/2020   HGBA1C 9.1 (H) 04/19/2020     Lipid Panel ( most recent) Lipid Panel      Component Value Date/Time   CHOL 164 04/19/2020 1049   TRIG 76 04/19/2020 1049   HDL 59 04/19/2020 1049   CHOLHDL 2.8 04/19/2020 1049   LDLCALC 91 04/19/2020 1049   LABVLDL 14 04/19/2020 1049      Lab Results  Component Value Date   TSH 1.790 04/19/2020  Assessment & Plan:   1. DM (diabetes mellitus) type 2, uncontrolled, with ketoacidosis (HCC)   - Jolette Lana has currently uncontrolled symptomatic type 2 DM since  44 years of age.  Shearon presents with significant improvement in her glycemic profile to near target levels.  Her recent point-of-care A1c was 10.1%.  She did not document any hypoglycemia.    Recent labs reviewed. - I had a long discussion with her about the progressive nature of diabetes and the pathology behind its complications. -her diabetes is complicated by obesity/sedentary life and she remains at a high risk for more acute and chronic complications which include CAD, CVA, CKD, retinopathy, and neuropathy. These are all discussed in detail with her.  - I have counseled her on diet  and weight management  by adopting a carbohydrate restricted/protein rich diet. Patient is encouraged to switch to  unprocessed or minimally processed     complex starch and increased protein intake (animal or plant source), fruits, and vegetables. -  she is advised to stick to a routine mealtimes to eat 3 meals  a day and avoid unnecessary snacks ( to snack only to correct hypoglycemia).   - she  admits there is a room for improvement in her diet and drink choices. -  Suggestion is made for her to avoid simple carbohydrates  from her diet including Cakes, Sweet Desserts / Pastries, Ice Cream, Soda (diet and regular), Sweet Tea, Candies, Chips, Cookies, Sweet Pastries,  Store Bought Juices, Alcohol in Excess of  1-2 drinks a day, Artificial Sweeteners, Coffee Creamer, and "Sugar-free" Products. This will help patient to have stable blood glucose profile and potentially  avoid unintended weight gain.   - she will be scheduled with Norm Salt, RDN, CDE for diabetes education.  - I have approached her with the following individualized plan to manage  her diabetes and patient agrees:   -In light of her presentation with significant glycemic improvement she will not need prandial insulin for now.    -She will however continue to require at least basal insulin.  She is advised to continue Lantus 60 units nightly, associated with monitoring of blood glucose twice a day-daily before breakfast and at bedtime.   - she is encouraged to call clinic for blood glucose levels less than 70 or above 300 mg /dl. -She has tolerated Metformin.  She is advised to continue Metformin 500 mg XR p.o. daily after breakfast. - she will be considered for incretin therapy as appropriate next visit.  - Specific targets for  A1c;  LDL, HDL,  and Triglycerides were discussed with the patient.  2) Blood Pressure /Hypertension:   Her blood pressure is controlled to target.   she is advised to continue her current medications including metoprolol 100 mg p.o. twice daily, Entresto 24-26 mg p.o. twice daily.  3) Lipids/Hyperlipidemia:   Review of her recent lipid panel showed uncontrolled  LDL at 91 .  she  is advised to continue   atorvastatin 40 mg p.o. daily at bedtime.   Side effects and precautions discussed with her.  4)  Weight/Diet:  Body mass index is 44.49 kg/m.  -   clearly complicating her diabetes care.   she is  a candidate for weight loss. I discussed with her the fact that loss of 5 - 10% of her  current body weight will have the most impact on her diabetes management.  Exercise, and detailed carbohydrates information provided  -  detailed on discharge instructions.  5) Chronic Care/Health Maintenance:  -she  is on ACEI/ARB and Statin medications and  is encouraged to initiate and continue to follow up with Ophthalmology, Dentist,  Podiatrist at least yearly or according  to recommendations, and advised to   stay away from smoking. I have recommended yearly flu vaccine and pneumonia vaccine at least every 5 years; moderate intensity exercise for up to 150 minutes weekly; and  sleep for at least 7 hours a day.    - she is  advised to maintain close follow up with [name], MD for primary care needs, as well as her other providers for optimal and coordinated care.   - Time spent on this patient care encounter:  35 min, of which > 50% was spent in  counseling and the rest reviewing her blood glucose logs , discussing her hypoglycemia and hyperglycemia episodes, reviewing her current and  previous labs / studies  ( including abstraction from other facilities) and medications  doses and developing a  long term treatment plan and documenting her care.   Please refer to Patient Instructions for Blood Glucose Monitoring and Insulin/Medications Dosing Guide"  in media tab for additional information. Please  also refer to " Patient Self Inventory" in the Media  tab for reviewed elements of pertinent patient history.  Julianne Rice participated in the discussions, expressed understanding, and voiced agreement with the above plans.  All questions were answered to her satisfaction. she is encouraged to contact clinic should she have any questions or concerns prior to her return visit.   Follow up plan: - Return in about 3 months (around 12/12/2020) for F/U with Pre-visit Labs, Urine MA - NV, A1c -NV, ABI in Office NV.  Marquis Lunch, MD [facility] [address] [place], [place] [address] Phone: [phone]  Fax: [phone]    09/11/2020, 6:03 PM  This note was partially dictated with voice recognition software. Similar sounding words can be transcribed inadequately or may not  be corrected upon review.

## 2020-09-16 NOTE — Progress Notes (Signed)
Cardiology Office Note:    Date:  09/18/2020   ID:  Connie Luna, DOB 1976/09/04, MRN [id number]  PCP:  [name], MD  Cardiologist:  [name], MD  Electrophysiologist:  None   Referring MD: [name], MD   Chief Complaint  Patient presents with  . Congestive Heart Failure    History of Present Illness:    Connie Luna is a 44 y.o. female with a hx of hypertension, hyperlipidemia, diabetes who presents for follow-up.  She was initially seen as a ED follow-up for chest pain on 02/28/2020.  Recent Covid infection in January.  Reports she woke up with chest pain around 3 AM on 3/30.  Described as sharp stabbing pain on left side of chest.  Lasted a few seconds and resolved, would occur repeatedly every few minutes for about 30 minutes.  She also describes a sensation that something is sitting in her chest, describes as feeling like food being stuck, it has been continuous for 1 week.  Denies any shortness of breath.  She typically walks for 20 minutes 2-3 times per week, denies any exertional chest pain or dyspnea.  She has been on losartan for hypertension, but was only taking about once per week.  No smoking history.  No history of heart disease in her immediate family.  Work-up in the ED included D-dimer negative, troponin 24 -> 22.  Lexiscan Myoview on 03/19/2020 showed small fixed defect at the apex, no evidence of ischemia (though no poor quality study with low cardiac counts compared to extracardiac activity), EF 31%.  TTE on 04/02/2020 showed basal inferior/inferoseptal akinesis with LVEF 35 to 40%, grade 2 diastolic dysfunction, moderate LV dilatation, no significant valvular disease, IVC small/collapsible.  RHC/LHC on 04/24/2020 showed normal coronary arteries, RA 7, RV 46/7, PA 36/15/26, PWCP 22, LVEDP 32, CI 4.5.  She was started on Lasix 40 mg daily and spironolactone 12.5 mg daily.  Cardiac MRI on 08/13/2020 showed LVEF 37%, normal RV function, mid wall  LGE.  Since last clinic visit, she reports that she has been doing well.  Denies any dyspnea or chest pain.  Denies any lightheadedness, syncope, or lower extremity edema.  States she has been monitoring her daily weights and BP.  BP has been 110s to 120s over 70s at home.   Wt Readings from Last 3 Encounters:  09/18/20 256 lb (116.1 kg)  09/17/20 256 lb 1.6 oz (116.2 kg)  09/11/20 259 lb 3.2 oz (117.6 kg)   BP Readings from Last 3 Encounters:  09/18/20 100/70  09/17/20 118/78  09/11/20 124/72     Past Medical History:  Diagnosis Date  . Diabetes mellitus without complication (HCC)   . Hypertension     Past Surgical History:  Procedure Laterality Date  . RIGHT/LEFT HEART CATH AND CORONARY ANGIOGRAPHY N/A 04/24/2020   Procedure: RIGHT/LEFT HEART CATH AND CORONARY ANGIOGRAPHY;  Surgeon: [name], MD;  Location: [facility] INVASIVE CV LAB;  Service: Cardiovascular;  Laterality: N/A;    Current Medications: Current Meds  Medication Sig  . APPLE CIDER VINEGAR PO Take 2 tablets by mouth in the morning and at bedtime. Goli  . aspirin EC 81 MG tablet Take 81 mg by mouth at bedtime.  Marland Kitchen atorvastatin (LIPITOR) 40 MG tablet Take 40 mg by mouth at bedtime.  . fexofenadine (ALLEGRA) 180 MG tablet Take 180 mg by mouth daily.  . fluticasone (FLONASE) 50 MCG/ACT nasal spray Place 2 sprays into both nostrils in the morning and at bedtime.  Marland Kitchen  Furosemide (LASIX PO) Take by mouth. As needed  . insulin glargine (LANTUS) 100 UNIT/ML Solostar Pen Inject 60 Units into the skin at bedtime.  . metFORMIN (GLUCOPHAGE XR) 500 MG 24 hr tablet Take 1 tablet (500 mg total) by mouth daily with breakfast.  . metoprolol (TOPROL XL) 200 MG 24 hr tablet Take 1 tablet (200 mg total) by mouth daily. (Patient taking differently: Take 200 mg by mouth daily. Take two tablets once daily.)  . omeprazole (PRILOSEC) 20 MG capsule Take 1 capsule (20 mg total) by mouth daily. (Patient taking differently: Take 20 mg by mouth  daily as needed (Reflux). )  . sacubitril-valsartan (ENTRESTO) 24-26 MG Take 1 tablet by mouth 2 (two) times daily.  Marland Kitchen spironolactone (ALDACTONE) 25 MG tablet TAKE 1/2 TABLET BY MOUTH EVERY DAY  . tetrahydrozoline-zinc (VISINE-AC) 0.05-0.25 % ophthalmic solution Place 2 drops into both eyes 2 (two) times daily as needed (Dry Eyes).   Current Facility-Administered Medications for the 09/18/20 encounter (Office Visit) with [name], MD  Medication  . sodium chloride flush (NS) 0.9 % injection 3 mL     Allergies:   Patient has no known allergies.   Social History   Socioeconomic History  . Marital status: Married    Spouse name: Not on file  . Number of children: Not on file  . Years of education: Not on file  . Highest education level: Not on file  Occupational History  . Not on file  Tobacco Use  . Smoking status: Never Smoker  . Smokeless tobacco: Never Used  Vaping Use  . Vaping Use: Never used  Substance and Sexual Activity  . Alcohol use: Never  . Drug use: Never  . Sexual activity: Never  Other Topics Concern  . Not on file  Social History Narrative  . Not on file   Social Determinants of Health   Financial Resource Strain:   . Difficulty of Paying Living Expenses: Not on file  Food Insecurity:   . Worried About Programme researcher, broadcasting/film/video in the Last Year: Not on file  . Ran Out of Food in the Last Year: Not on file  Transportation Needs:   . Lack of Transportation (Medical): Not on file  . Lack of Transportation (Non-Medical): Not on file  Physical Activity:   . Days of Exercise per Week: Not on file  . Minutes of Exercise per Session: Not on file  Stress:   . Feeling of Stress : Not on file  Social Connections:   . Frequency of Communication with Friends and Family: Not on file  . Frequency of Social Gatherings with Friends and Family: Not on file  . Attends Religious Services: Not on file  . Active Member of Clubs or Organizations: Not on file  .  Attends Banker Meetings: Not on file  . Marital Status: Not on file     Family History: The patient's family history includes Diabetes in her father, paternal aunt, paternal grandfather, paternal grandmother, and paternal uncle; Hyperlipidemia in her mother; Hypertension in her mother. There is no history of Breast cancer.  ROS:   Please see the history of present illness.     All other systems reviewed and are negative.  EKGs/Labs/Other Studies Reviewed:    The following studies were reviewed today:   EKG:  EKG is ordered today.  The ekg ordered demonstrates normal sinus rhythm, rate 75, poor R wave progression, nonspecific T wave flattening  Recent Labs: 04/19/2020: Platelets 245;  TSH 1.790 04/24/2020: Hemoglobin 10.5 05/06/2020: Magnesium 1.8 09/17/2020: BUN 8; Creatinine, Ser 0.81; Potassium 4.1; Sodium 139  Recent Lipid Panel    Component Value Date/Time   CHOL 164 04/19/2020 1049   TRIG 76 04/19/2020 1049   HDL 59 04/19/2020 1049   CHOLHDL 2.8 04/19/2020 1049   LDLCALC 91 04/19/2020 1049    Physical Exam:    VS:  BP 100/70   Pulse 75   Ht 5\' 4"  (1.626 m)   Wt 256 lb (116.1 kg)   SpO2 98%   BMI 43.94 kg/m     Wt Readings from Last 3 Encounters:  09/18/20 256 lb (116.1 kg)  09/17/20 256 lb 1.6 oz (116.2 kg)  09/11/20 259 lb 3.2 oz (117.6 kg)     GEN:  in no acute distress HEENT: Normal NECK: No JVD LYMPHATICS: No lymphadenopathy CARDIAC:RRR, no murmurs, rubs, gallops RESPIRATORY:  Clear to auscultation without rales, wheezing or rhonchi  ABDOMEN: Soft, non-tender, non-distended MUSCULOSKELETAL:  No edema; No deformity  SKIN: Warm and dry NEUROLOGIC:  Alert and oriented x 3 PSYCHIATRIC:  Normal affect   ASSESSMENT:    1. Chronic combined systolic (congestive) and diastolic (congestive) heart failure (HCC)   2. Essential hypertension   3. Hyperlipidemia, unspecified hyperlipidemia type    PLAN:     Chronic combined systolic and  diastolic heart failure:  Lexiscan Myoview on 03/19/2020 showed small fixed defect at the apex, no evidence of ischemia but was poor quality study with low cardiac counts compared to extracardiac activity.  EF 31%.  TTE on 04/02/2020 showed basal inferior/inferoseptal akinesis with LVEF 35 to 40%, grade 2 diastolic dysfunction, moderate LV dilatation, no significant valvular disease, IVC small/collapsible.  RHC/LHC on 04/24/2020 showed normal coronary arteries, RA 7, RV 46/7, PA 36/15/26, PWCP 22, LVEDP 32, CI 4.5.  Cardiac MRI on 08/13/2020 showed LVEF 37%, normal RV function, mid wall LGE. -Continue Entresto 24-26 mg twice daily -Continue toprol XL 200 mg daily -Continue spironolactone 12.5 mg daily -Lasix was stopped at prior pharmacy visit, currently on as needed Lasix.  Advised to monitor daily weights and take Lasix if gains more than 3 pounds in 1 day or 5 pounds in one week -BP soft, no room to further increase GDMT at this time -Ideally would start SGLT2 inhibitor, would defer to endocrinology  Hypertension: On Entresto, Aldactone, Toprol-XL.  Appears well controlled  Type 2 diabetes: On Lantus.  A1c 10.1.  Follows with endocrinology.  Would consider starting SGLT2 inhibitor  Hyperlipidemia: Continue atorvastatin 40 mg daily.  LDL 91  Anemia: Hemoglobin 10.4 on 04/19/2020.   Iron studies consistent with iron deficiency anemia, started on iron supplementation.  Reports heavy menstrual bleeding, otherwise denies any bleeding issues  RTC in 2 months  Medication Adjustments/Labs and Tests Ordered: Current medicines are reviewed at length with the patient today.  Concerns regarding medicines are outlined above.  Orders Placed This Encounter  Procedures  . EKG 12-Lead   No orders of the defined types were placed in this encounter.   Patient Instructions  Medication Instructions:  Your Physician recommend you continue on your current medication as directed.    *If you need a refill on your  cardiac medications before your next appointment, please call your pharmacy*   Lab Work: None ordered   Testing/Procedures: None ordered    Follow-Up: At [facility], you and your health needs are our priority.  As part of our continuing mission to provide you with exceptional heart care, we  have created designated Provider Care Teams.  These Care Teams include your primary Cardiologist (physician) and Advanced Practice Providers (APPs -  Physician Assistants and Nurse Practitioners) who all work together to provide you with the care you need, when you need it.  We recommend signing up for the patient portal called "MyChart".  Sign up information is provided on this After Visit Summary.  MyChart is used to connect with patients for Virtual Visits (Telemedicine).  Patients are able to view lab/test results, encounter notes, upcoming appointments, etc.  Non-urgent messages can be sent to your provider as well.   To learn more about what you can do with MyChart, go to [email or web address].    Your next appointment:   2 month(s)  The format for your next appointment:   In Person  Provider:   Epifanio Lescheshristopher Bellamia Ferch, MD       Signed, Little Ishikawahristopher L Rachael Zapanta, MD  09/18/2020 10:20 PM    [facility]

## 2020-09-16 NOTE — Progress Notes (Signed)
Patient ID: Connie Luna                 DOB: 12-May-1976                      MRN: [id number]     HPI: Connie Luna is a 44 y.o. female referred by Dr. Bjorn Pippin for CHF management. PMH is significant for CHF, HTN, HLD, and DM.  Latest EF on myoview on 03/19/20 was 31%. TTE on 04/02/20 showed EF 35-40%. Patient was subsequently started on Entresto 05/18/20. 05/20/20 spironolactone was increased to 25mg  daily and metoprolol was increased to 150mg  daily. Follow up labs relatively stable with a small increase in scr after titration. After last BMP & visit 08/13/20, the patient was called to request titration of Entresto, but the patient could not be reached. At this time, the patient was also told to use lasix PRN rather than scheduled after toleration of Entresto initiation and plans to increase further.  Of note, patient has had very uncontrolled DM (last A1C 10.1%).  She is currently on metformin 500mg  daily and Lantus 60 units nightly.  Patient has been doing well. She did bring her BP cuff and log with her today. She reports that her weight today is very typical for her & denies dizziness, fogginess, or headaches. She reports no symptoms of orthostasis. She does report that since her lasix was discontinued, she noticed that there were a few days that she weighed herself where there was a >5lb change. She did take a lasix on those days and noticed that her weight went back to normal. This has only occurred rarely. Denies increased SOB, denies othorpnea.   Current CHF meds: Lasix 40 mg daily prn, spironolactone 12.5 mg daily, metoprolol succinate 150 mg daily, Entresto 24/26 BID Previously tried: losartan 25 mg daily BP goal: <130/80  Family History: Both grandmothers had heart disease  Social History: no smoking history, very occasional EtOH   Diet: Since visit with endocrinology, is trying to adjust her diet.  Eliminating sweets sugars, cakes  - patient has tried to eliminated sugar from  her diet over the last few weeks - eliminated soda   Exercise:  - has not been exercising as much, 3 x week walking (previously 4) - denies exercise intolerance d/t HF symptoms  Home BP readings: 129/72, 114/73, 110/76. 96/62, 103/66, 106/79   Wt Readings from Last 3 Encounters:  09/11/20 259 lb 3.2 oz (117.6 kg)  09/03/20 257 lb (116.6 kg)  08/13/20 252 lb 3.2 oz (114.4 kg)   BP Readings from Last 3 Encounters:  09/11/20 124/72  09/03/20 135/85  08/13/20 110/70   Pulse Readings from Last 3 Encounters:  09/11/20 72  09/03/20 74  08/13/20 63    Renal function: CrCl cannot be calculated (Patient's most recent lab result is older than the maximum 21 days allowed.).  Past Medical History:  Diagnosis Date  . Diabetes mellitus without complication (HCC)   . Hypertension     Current Outpatient Medications on File Prior to Visit  Medication Sig Dispense Refill  . albuterol (VENTOLIN HFA) 108 (90 Base) MCG/ACT inhaler Inhale 2 puffs into the lungs every 6 (six) hours as needed.    . APPLE CIDER VINEGAR PO Take 2 tablets by mouth in the morning and at bedtime. Goli    . aspirin EC 81 MG tablet Take 81 mg by mouth at bedtime.    09/13/20 atorvastatin (LIPITOR) 40 MG tablet Take 40 mg by  mouth at bedtime.    . fexofenadine (ALLEGRA) 180 MG tablet Take 180 mg by mouth daily.    . fluticasone (FLONASE) 50 MCG/ACT nasal spray Place 2 sprays into both nostrils in the morning and at bedtime.    . insulin glargine (LANTUS) 100 UNIT/ML Solostar Pen Inject 60 Units into the skin at bedtime.    . metFORMIN (GLUCOPHAGE XR) 500 MG 24 hr tablet Take 1 tablet (500 mg total) by mouth daily with breakfast. 30 tablet 3  . metoprolol succinate (TOPROL-XL) 100 MG 24 hr tablet Take 1.5 tablets (150 mg total) by mouth daily. Take with or immediately following a meal. 135 tablet 3  . omeprazole (PRILOSEC) 20 MG capsule Take 1 capsule (20 mg total) by mouth daily. (Patient taking differently: Take 20 mg by mouth  daily as needed (Reflux). ) 1 capsule 14  . sacubitril-valsartan (ENTRESTO) 24-26 MG Take 1 tablet by mouth 2 (two) times daily. 60 tablet 5  . spironolactone (ALDACTONE) 25 MG tablet TAKE 1/2 TABLET BY MOUTH EVERY DAY 45 tablet 9  . tetrahydrozoline-zinc (VISINE-AC) 0.05-0.25 % ophthalmic solution Place 2 drops into both eyes 2 (two) times daily as needed (Dry Eyes).     Current Facility-Administered Medications on File Prior to Visit  Medication Dose Route Frequency Provider Last Rate Last Admin  . sodium chloride flush (NS) 0.9 % injection 3 mL  3 mL Intravenous Q12H [name], MD        No Known Allergies  There were no vitals taken for this visit.   Assessment/Plan:  1. Heart Failure -  Patient's blood pressure in room is 118/78 which is at goal of <130/80 without symptoms of hypotension. She has needed lasix roughly 1x/wk, but her frequent weighing and proven ability to take lasix PRN suggests that reinitiation of lasix is not required for this patient as of current. Future considerations with HF mortality benefit for this patient include titration of Entresto, titration of metoprolol succinate to GDMT 200mg  daily, increase of spironolactone to 25mg  daily, or initiation of an SGLT-2i. Though patient has low-normal blood pressure that is asymptomatic, will elect to increase metoprolol succinate to 200mg  daily given that it has less associated hypotensive effects. Will elect to not initiate SGLT2i today despite comorbid T2DM diagnosis as the patient's blood sugar has been very uncontrolled and this would render her at increased risk of urological infection. This should be a consideration in the future. Titration of spironolactone to 25mg  daily can be considered if the patient's latest BMP reveals potassium <5 and normal renal function.   As such, patient was requested to increase her metoprolol succinate to 200 mg. The patient was counseled on SE associated with the increase and  requested to maintain her BP & HR log as she has been and to bring this log to future appointments. Home BP monitor was confirmed against office readings and appeared to be similar. Will get BMP today to check renal function and potassium after Entresto initiation. F/U appointment scheduled for 15mo.    Thank you,  , Pharmacy Student Class of 2022  , PharmD, BCACP, CDCES [facility] [address] [place], [place] [facility] Phone: [phone]; Fax: [phone] 09/17/2020 3:35 PM

## 2020-09-17 ENCOUNTER — Other Ambulatory Visit: Payer: Self-pay

## 2020-09-17 ENCOUNTER — Ambulatory Visit (INDEPENDENT_AMBULATORY_CARE_PROVIDER_SITE_OTHER): Payer: Medicaid Other | Admitting: Pharmacist

## 2020-09-17 VITALS — BP 118/78 | HR 72 | Wt 256.1 lb

## 2020-09-17 DIAGNOSIS — I420 Dilated cardiomyopathy: Secondary | ICD-10-CM | POA: Diagnosis not present

## 2020-09-17 MED ORDER — METOPROLOL SUCCINATE ER 200 MG PO TB24: 200.0000 mg | ORAL_TABLET | Freq: Every day | ORAL | 2 refills | Status: DC

## 2020-09-17 NOTE — Patient Instructions (Signed)
It was wonderful seeing you today!  Your blood pressure looks wonderful. Great job trying to make diet and exercise changes.   We would like you to increase your metoprolol succinate today to 200 mg daily.  We will give you a prescription for 200 mg tablets. For now, you can take two of the 100 mg tablets once daily.   If you notice that you are having increased need for your lasix, increased weight gain, or getting more short of breath after increasing this medication, please call us. Our direct line is [phone].   We will get labs on you today to check your electrolytes and renal function. We would like to see you in 1 month.   Thank you! Abby

## 2020-09-18 ENCOUNTER — Ambulatory Visit (INDEPENDENT_AMBULATORY_CARE_PROVIDER_SITE_OTHER): Payer: Medicaid Other | Admitting: Cardiology

## 2020-09-18 ENCOUNTER — Telehealth: Payer: Self-pay | Admitting: Pharmacist

## 2020-09-18 VITALS — BP 100/70 | HR 75 | Ht 64.0 in | Wt 256.0 lb

## 2020-09-18 DIAGNOSIS — I5042 Chronic combined systolic (congestive) and diastolic (congestive) heart failure: Secondary | ICD-10-CM | POA: Diagnosis not present

## 2020-09-18 DIAGNOSIS — I10 Essential (primary) hypertension: Secondary | ICD-10-CM

## 2020-09-18 DIAGNOSIS — E785 Hyperlipidemia, unspecified: Secondary | ICD-10-CM

## 2020-09-18 LAB — BASIC METABOLIC PANEL
BUN/Creatinine Ratio: 10 (ref 9–23)
BUN: 8 mg/dL (ref 6–24)
CO2: 26 mmol/L (ref 20–29)
Calcium: 9.3 mg/dL (ref 8.7–10.2)
Chloride: 102 mmol/L (ref 96–106)
Creatinine, Ser: 0.81 mg/dL (ref 0.57–1.00)
GFR calc Af Amer: 101 mL/min/{1.73_m2} (ref >59)
GFR calc non Af Amer: 88 mL/min/{1.73_m2} (ref >59)
Glucose: 115 mg/dL — ABNORMAL HIGH (ref 65–99)
Potassium: 4.1 mmol/L (ref 3.5–5.2)
Sodium: 139 mmol/L (ref 134–144)

## 2020-09-18 NOTE — Telephone Encounter (Signed)
Spoke with patient and relayed lab results stable.  Patient to continue Entresto, metoprolol, spironolactone

## 2020-09-18 NOTE — Patient Instructions (Signed)
Medication Instructions:  Your Physician recommend you continue on your current medication as directed.    *If you need a refill on your cardiac medications before your next appointment, please call your pharmacy*   Lab Work: None ordered   Testing/Procedures: None ordered    Follow-Up: At [facility], you and your health needs are our priority.  As part of our continuing mission to provide you with exceptional heart care, we have created designated Provider Care Teams.  These Care Teams include your primary Cardiologist (physician) and Advanced Practice Providers (APPs -  Physician Assistants and Nurse Practitioners) who all work together to provide you with the care you need, when you need it.  We recommend signing up for the patient portal called "MyChart".  Sign up information is provided on this After Visit Summary.  MyChart is used to connect with patients for Virtual Visits (Telemedicine).  Patients are able to view lab/test results, encounter notes, upcoming appointments, etc.  Non-urgent messages can be sent to your provider as well.   To learn more about what you can do with MyChart, go to [email or web address].    Your next appointment:   2 month(s)  The format for your next appointment:   In Person  Provider:   Epifanio Lesches, MD

## 2020-10-10 ENCOUNTER — Ambulatory Visit: Payer: Medicaid Other | Admitting: Nutrition

## 2020-10-15 ENCOUNTER — Ambulatory Visit: Payer: Medicaid Other

## 2020-10-22 ENCOUNTER — Ambulatory Visit (INDEPENDENT_AMBULATORY_CARE_PROVIDER_SITE_OTHER): Payer: Medicaid Other | Admitting: Pharmacist

## 2020-10-22 ENCOUNTER — Other Ambulatory Visit: Payer: Self-pay

## 2020-10-22 VITALS — BP 106/72 | HR 78 | Wt 257.0 lb

## 2020-10-22 DIAGNOSIS — I5041 Acute combined systolic (congestive) and diastolic (congestive) heart failure: Secondary | ICD-10-CM

## 2020-10-22 NOTE — Progress Notes (Signed)
Patient ID: Connie Luna                 DOB: 1976-08-13                      MRN: [id number]     HPI: Connie Luna is a 44 y.o. female referred by Dr. Bjorn Pippin to CHF clinic. PMH is significant for HTN, HLD, and DM.  Latest EF on myoview on 03/19/20 was 31%. TTE on 04/02/20 showed EF 35-40%. Patient was subsequently started on Entresto 05/18/20. 05/20/20 spironolactone was increased to 25mg  daily and metoprolol was increased to 150mg  daily. Follow up labs relatively stable with a small increase in scr after titration. After last BMP & visit 08/13/20, the patient was called to request titration of Entresto, but the patient could not be reached. At this time, the patient was also told to use lasix PRN rather than scheduled after toleration of Entresto initiation and plans to increase further.  Of note, patient has had very uncontrolled DM (last A1C 10.1%).  She is currently on metformin 500mg  daily and Lantus 60 units nightly.  Patient has been doing well. She forgot her BP readings. She reports that her weight today is very typical for her & denies dizziness, fogginess, or headaches. She reports no symptoms of orthostasis.  Has changed her furosemide 40mg  from PRN to every other day and noticed this has helped.  Weight has been steady.  Reports blood sugars have been better controlled and is seeing a dietician in December.  At last visit metoprolol was increased to 200mg  daily and she said she had chest discomfort for about 3 days but this has passed.    Reports she has not been exercising as much recently however.  Current CHF meds: Entresto 24/26 BID, Toprol XL 200mg  daily, spironolactone 12.5mg  daily, furosemide 40mg  EOD  BP goal: <130/80  Diet: cooking more at home.  Eating out less  Exercise: not walking as much  Wt Readings from Last 3 Encounters:  09/18/20 256 lb (116.1 kg)  09/17/20 256 lb 1.6 oz (116.2 kg)  09/11/20 259 lb 3.2 oz (117.6 kg)   BP Readings from Last 3  Encounters:  09/18/20 100/70  09/17/20 118/78  09/11/20 124/72   Pulse Readings from Last 3 Encounters:  09/18/20 75  09/17/20 72  09/11/20 72    Renal function: CrCl cannot be calculated (Patient's most recent lab result is older than the maximum 21 days allowed.).  Past Medical History:  Diagnosis Date   Diabetes mellitus without complication (HCC)    Hypertension     Current Outpatient Medications on File Prior to Visit  Medication Sig Dispense Refill   APPLE CIDER VINEGAR PO Take 2 tablets by mouth in the morning and at bedtime. [place]     aspirin EC 81 MG tablet Take 81 mg by mouth at bedtime.     atorvastatin (LIPITOR) 40 MG tablet Take 40 mg by mouth at bedtime.     fexofenadine (ALLEGRA) 180 MG tablet Take 180 mg by mouth daily.     fluticasone (FLONASE) 50 MCG/ACT nasal spray Place 2 sprays into both nostrils in the morning and at bedtime.     Furosemide (LASIX PO) Take by mouth. As needed     insulin glargine (LANTUS) 100 UNIT/ML Solostar Pen Inject 60 Units into the skin at bedtime.     metFORMIN (GLUCOPHAGE XR) 500 MG 24 hr tablet Take 1 tablet (500 mg total) by mouth daily with  breakfast. 30 tablet 3   metoprolol (TOPROL XL) 200 MG 24 hr tablet Take 1 tablet (200 mg total) by mouth daily. (Patient taking differently: Take 200 mg by mouth daily. Take two tablets once daily.) 30 tablet 2   omeprazole (PRILOSEC) 20 MG capsule Take 1 capsule (20 mg total) by mouth daily. (Patient taking differently: Take 20 mg by mouth daily as needed (Reflux). ) 1 capsule 14   sacubitril-valsartan (ENTRESTO) 24-26 MG Take 1 tablet by mouth 2 (two) times daily. 60 tablet 5   spironolactone (ALDACTONE) 25 MG tablet TAKE 1/2 TABLET BY MOUTH EVERY DAY 45 tablet 9   tetrahydrozoline-zinc (VISINE-AC) 0.05-0.25 % ophthalmic solution Place 2 drops into both eyes 2 (two) times daily as needed (Dry Eyes).     Current Facility-Administered Medications on File Prior to Visit    Medication Dose Route Frequency Provider Last Rate Last Admin   sodium chloride flush (NS) 0.9 % injection 3 mL  3 mL Intravenous Q12H [name], MD        No Known Allergies   Assessment/Plan:  1. CHF - BP today 106/72 which is at goal of <130/80.  Patient pleased with progress and how she feels.  Hesitant to increase spironolactone or Entresto at this time due to blood pressure.  If patient A1c has reduced from 10.1 at next PCP visit, consider adding SGLT2i as next step. No med changes at this time.  Recommended patient focus on diet and exercise to help with BP and BG. Will follow up with patient after appointment with Dr. Bjorn Pippin on 12/30.  Laural Golden, PharmD, BCACP, CDCES, CPP [facility] [address], [place], [place] [address] Phone: [phone]; Fax: [phone] 10/22/2020 5:23 PM

## 2020-10-22 NOTE — Patient Instructions (Addendum)
It was good seeing you again!  We would like to keep your blood pressure less than 130/80  Continue your Entresto 24/26 twice a day Continue your metoprolol succinate 200mg  daily Continue your spironolactone 12.5mg  daily Continue your furosemide 40 mg every other day or as needed  Continue healthy eating, limiting salt and carbohydrates  Begin walking again, increase up to 30 minutes a day at least 5 days a week  Call with any questions!   , PharmD, BCACP, CDCES, CPP [facility] [address] [place], [place] Phone: [phone]; Fax: [phone] 10/22/2020 3:25 PM

## 2020-10-29 ENCOUNTER — Ambulatory Visit: Payer: Medicaid Other | Admitting: Nutrition

## 2020-11-04 ENCOUNTER — Other Ambulatory Visit: Payer: Self-pay | Admitting: Cardiology

## 2020-11-24 NOTE — Progress Notes (Deleted)
Cardiology Office Note:    Date:  11/24/2020   ID:  Connie Luna, DOB 1975/12/25, MRN [id number]  PCP:  [name], MD  Cardiologist:  [name], MD  Electrophysiologist:  None   Referring MD: [name], MD   No chief complaint on file.   History of Present Illness:    Connie Luna is a 44 y.o. female with a hx of hypertension, hyperlipidemia, diabetes who presents for follow-up.  She was initially seen as a ED follow-up for chest pain on 02/28/2020.  Recent Covid infection in January.  Reports she woke up with chest pain around 3 AM on 3/30.  Described as sharp stabbing pain on left side of chest.  Lasted a few seconds and resolved, would occur repeatedly every few minutes for about 30 minutes.  She also describes a sensation that something is sitting in her chest, describes as feeling like food being stuck, it has been continuous for 1 week.  Denies any shortness of breath.  She typically walks for 20 minutes 2-3 times per week, denies any exertional chest pain or dyspnea.  She has been on losartan for hypertension, but was only taking about once per week.  No smoking history.  No history of heart disease in her immediate family.  Work-up in the ED included D-dimer negative, troponin 24 -> 22.  Lexiscan Myoview on 03/19/2020 showed small fixed defect at the apex, no evidence of ischemia (though no poor quality study with low cardiac counts compared to extracardiac activity), EF 31%.  TTE on 04/02/2020 showed basal inferior/inferoseptal akinesis with LVEF 35 to 40%, grade 2 diastolic dysfunction, moderate LV dilatation, no significant valvular disease, IVC small/collapsible.  RHC/LHC on 04/24/2020 showed normal coronary arteries, RA 7, RV 46/7, PA 36/15/26, PWCP 22, LVEDP 32, CI 4.5.  She was started on Lasix 40 mg daily and spironolactone 12.5 mg daily.  Cardiac MRI on 08/13/2020 showed LVEF 37%, normal RV function, mid wall LGE.  Since last clinic visit,   she reports  that she has been doing well.  Denies any dyspnea or chest pain.  Denies any lightheadedness, syncope, or lower extremity edema.  States she has been monitoring her daily weights and BP.  BP has been 110s to 120s over 70s at home.   Wt Readings from Last 3 Encounters:  10/22/20 257 lb (116.6 kg)  09/18/20 256 lb (116.1 kg)  09/17/20 256 lb 1.6 oz (116.2 kg)   BP Readings from Last 3 Encounters:  10/22/20 106/72  09/18/20 100/70  09/17/20 118/78     Past Medical History:  Diagnosis Date  . Diabetes mellitus without complication (HCC)   . Hypertension     Past Surgical History:  Procedure Laterality Date  . RIGHT/LEFT HEART CATH AND CORONARY ANGIOGRAPHY N/A 04/24/2020   Procedure: RIGHT/LEFT HEART CATH AND CORONARY ANGIOGRAPHY;  Surgeon: [name], MD;  Location: [facility] INVASIVE CV LAB;  Service: Cardiovascular;  Laterality: N/A;    Current Medications: No outpatient medications have been marked as taking for the 11/28/20 encounter (Appointment) with [name], MD.   Current Facility-Administered Medications for the 11/28/20 encounter (Appointment) with [name], MD  Medication  . sodium chloride flush (NS) 0.9 % injection 3 mL     Allergies:   Patient has no known allergies.   Social History   Socioeconomic History  . Marital status: Married    Spouse name: Not on file  . Number of children: Not on file  . Years of  education: Not on file  . Highest education level: Not on file  Occupational History  . Not on file  Tobacco Use  . Smoking status: Never Smoker  . Smokeless tobacco: Never Used  Vaping Use  . Vaping Use: Never used  Substance and Sexual Activity  . Alcohol use: Never  . Drug use: Never  . Sexual activity: Never  Other Topics Concern  . Not on file  Social History Narrative  . Not on file   Social Determinants of Health   Financial Resource Strain: Not on file  Food Insecurity: Not on file  Transportation Needs:  Not on file  Physical Activity: Not on file  Stress: Not on file  Social Connections: Not on file     Family History: The patient's family history includes Diabetes in her father, paternal aunt, paternal grandfather, paternal grandmother, and paternal uncle; Hyperlipidemia in her mother; Hypertension in her mother. There is no history of Breast cancer.  ROS:   Please see the history of present illness.     All other systems reviewed and are negative.  EKGs/Labs/Other Studies Reviewed:    The following studies were reviewed today:   EKG:  EKG is ordered today.  The ekg ordered demonstrates normal sinus rhythm, rate 75, poor R wave progression, nonspecific T wave flattening  Recent Labs: 04/19/2020: Platelets 245; TSH 1.790 04/24/2020: Hemoglobin 10.5 05/06/2020: Magnesium 1.8 09/17/2020: BUN 8; Creatinine, Ser 0.81; Potassium 4.1; Sodium 139  Recent Lipid Panel    Component Value Date/Time   CHOL 164 04/19/2020 1049   TRIG 76 04/19/2020 1049   HDL 59 04/19/2020 1049   CHOLHDL 2.8 04/19/2020 1049   LDLCALC 91 04/19/2020 1049    Physical Exam:    VS:  There were no vitals taken for this visit.    Wt Readings from Last 3 Encounters:  10/22/20 257 lb (116.6 kg)  09/18/20 256 lb (116.1 kg)  09/17/20 256 lb 1.6 oz (116.2 kg)     GEN:  in no acute distress HEENT: Normal NECK: No JVD LYMPHATICS: No lymphadenopathy CARDIAC:RRR, no murmurs, rubs, gallops RESPIRATORY:  Clear to auscultation without rales, wheezing or rhonchi  ABDOMEN: Soft, non-tender, non-distended MUSCULOSKELETAL:  No edema; No deformity  SKIN: Warm and dry NEUROLOGIC:  Alert and oriented x 3 PSYCHIATRIC:  Normal affect   ASSESSMENT:    No diagnosis found. PLAN:     Chronic combined systolic and diastolic heart failure:  Lexiscan Myoview on 03/19/2020 showed small fixed defect at the apex, no evidence of ischemia but was poor quality study with low cardiac counts compared to extracardiac activity.  EF  31%.  TTE on 04/02/2020 showed basal inferior/inferoseptal akinesis with LVEF 35 to 40%, grade 2 diastolic dysfunction, moderate LV dilatation, no significant valvular disease, IVC small/collapsible.  RHC/LHC on 04/24/2020 showed normal coronary arteries, RA 7, RV 46/7, PA 36/15/26, PWCP 22, LVEDP 32, CI 4.5.  Cardiac MRI on 08/13/2020 showed LVEF 37%, normal RV function, mid wall LGE. -Continue Entresto 24-26 mg twice daily -Continue toprol XL 200 mg daily -Continue spironolactone 12.5 mg daily -Lasix was stopped at prior pharmacy visit, currently on as needed Lasix.  Advised to monitor daily weights and take Lasix if gains more than 3 pounds in 1 day or 5 pounds in one week -BP soft, no room to further increase GDMT at this time -Ideally would start SGLT2 inhibitor, would defer to endocrinology  Hypertension: On Entresto, Aldactone, Toprol-XL.  Appears well controlled  Type 2 diabetes: On Lantus.  A1c 10.1.  Follows with endocrinology.  Would consider starting SGLT2 inhibitor  Hyperlipidemia: Continue atorvastatin 40 mg daily.  LDL 91  Anemia: Hemoglobin 10.4 on 04/19/2020.   Iron studies consistent with iron deficiency anemia, started on iron supplementation.  Reports heavy menstrual bleeding, otherwise denies any bleeding issues  RTC in***  Medication Adjustments/Labs and Tests Ordered: Current medicines are reviewed at length with the patient today.  Concerns regarding medicines are outlined above.  No orders of the defined types were placed in this encounter.  No orders of the defined types were placed in this encounter.   There are no Patient Instructions on file for this visit.   Signed, [name], MD  11/24/2020 5:01 PM    [facility]

## 2020-11-28 ENCOUNTER — Ambulatory Visit: Payer: Medicaid Other | Admitting: Cardiology

## 2020-11-29 ENCOUNTER — Other Ambulatory Visit: Payer: Self-pay | Admitting: "Endocrinology

## 2020-12-05 LAB — LIPID PANEL
Chol/HDL Ratio: 3.5 ratio (ref 0.0–4.4)
Cholesterol, Total: 194 mg/dL (ref 100–199)
HDL: 55 mg/dL (ref >39)
LDL Chol Calc (NIH): 123 mg/dL — ABNORMAL HIGH (ref 0–99)
Triglycerides: 88 mg/dL (ref 0–149)
VLDL Cholesterol Cal: 16 mg/dL (ref 5–40)

## 2020-12-05 LAB — TSH: TSH: 0.613 u[IU]/mL (ref 0.450–4.500)

## 2020-12-05 LAB — T4, FREE: Free T4: 1.17 ng/dL (ref 0.82–1.77)

## 2020-12-08 NOTE — Progress Notes (Signed)
Cardiology Office Note:    Date:  12/11/2020   ID:  Connie Luna, DOB 1975-12-01, MRN [id number]  PCP:  [name], MD  Cardiologist:  [name], MD  Electrophysiologist:  None   Referring MD: [name], MD   Chief Complaint  Patient presents with  . Congestive Heart Failure    History of Present Illness:    Connie Luna is a 45 y.o. female with a hx of hypertension, hyperlipidemia, diabetes who presents for follow-up.  She was initially seen as a ED follow-up for chest pain on 02/28/2020.  Recent Covid infection in January.  Reports she woke up with chest pain around 3 AM on 3/30.  Described as sharp stabbing pain on left side of chest.  Lasted a few seconds and resolved, would occur repeatedly every few minutes for about 30 minutes.  She also describes a sensation that something is sitting in her chest, describes as feeling like food being stuck, it has been continuous for 1 week.  Denies any shortness of breath.  She typically walks for 20 minutes 2-3 times per week, denies any exertional chest pain or dyspnea.  She has been on losartan for hypertension, but was only taking about once per week.  No smoking history.  No history of heart disease in her immediate family.  Work-up in the ED included D-dimer negative, troponin 24 -> 22.  Lexiscan Myoview on 03/19/2020 showed small fixed defect at the apex, no evidence of ischemia (though no poor quality study with low cardiac counts compared to extracardiac activity), EF 31%.  TTE on 04/02/2020 showed basal inferior/inferoseptal akinesis with LVEF 35 to 40%, grade 2 diastolic dysfunction, moderate LV dilatation, no significant valvular disease, IVC small/collapsible.  RHC/LHC on 04/24/2020 showed normal coronary arteries, RA 7, RV 46/7, PA 36/15/26, PWCP 22, LVEDP 32, CI 4.5.  She was started on Lasix 40 mg daily and spironolactone 12.5 mg daily.  Cardiac MRI on 08/13/2020 showed LVEF 37%, normal RV function, mid wall  LGE.  Since last clinic visit, she reports that she has been doing well.  Reports occasional chest pain, about every 2 weeks, that she describes as right-sided pain lasting for few seconds and occurring at rest.  She denies any exertional symptoms, though has not been exercising.  Denies any shortness of breath, lightheadedness, syncope, or lower extremity edema.  She has been taking her Lasix every other day.  Reports weight has been stable.  Denies any palpitations.  Reports BP has been 110s over 60s at home.   Wt Readings from Last 3 Encounters:  12/11/20 252 lb (114.3 kg)  10/22/20 257 lb (116.6 kg)  09/18/20 256 lb (116.1 kg)    BP Readings from Last 3 Encounters:  12/11/20 134/78  10/22/20 106/72  09/18/20 100/70     Past Medical History:  Diagnosis Date  . Diabetes mellitus without complication (HCC)   . Hypertension     Past Surgical History:  Procedure Laterality Date  . RIGHT/LEFT HEART CATH AND CORONARY ANGIOGRAPHY N/A 04/24/2020   Procedure: RIGHT/LEFT HEART CATH AND CORONARY ANGIOGRAPHY;  Surgeon: [name], MD;  Location: [facility] INVASIVE CV LAB;  Service: Cardiovascular;  Laterality: N/A;    Current Medications: Current Meds  Medication Sig  . APPLE CIDER VINEGAR PO Take 2 tablets by mouth in the morning and at bedtime. [place]  . aspirin EC 81 MG tablet Take 81 mg by mouth at bedtime.  Marland Kitchen atorvastatin (LIPITOR) 40 MG tablet Take 40 mg by mouth  at bedtime.  Marland Kitchen ENTRESTO 24-26 MG TAKE 1 TABLET BY MOUTH 2 (TWO) TIMES DAILY.  . fexofenadine (ALLEGRA) 180 MG tablet Take 180 mg by mouth daily.  . fluticasone (FLONASE) 50 MCG/ACT nasal spray Place 2 sprays into both nostrils in the morning and at bedtime.  . Furosemide (LASIX PO) Take 40 mg by mouth every other day. Or As needed  . insulin glargine (LANTUS) 100 UNIT/ML Solostar Pen Inject 60 Units into the skin at bedtime.  . metFORMIN (GLUCOPHAGE-XR) 500 MG 24 hr tablet TAKE 1 TABLET BY MOUTH EVERY DAY WITH BREAKFAST  .  metoprolol (TOPROL XL) 200 MG 24 hr tablet Take 1 tablet (200 mg total) by mouth daily. (Patient taking differently: Take 200 mg by mouth daily. Take two tablets once daily.)  . omeprazole (PRILOSEC) 20 MG capsule Take 1 capsule (20 mg total) by mouth daily. (Patient taking differently: Take 20 mg by mouth daily as needed (Reflux).)  . spironolactone (ALDACTONE) 25 MG tablet TAKE 1/2 TABLET BY MOUTH EVERY DAY  . tetrahydrozoline-zinc (VISINE-AC) 0.05-0.25 % ophthalmic solution Place 2 drops into both eyes 2 (two) times daily as needed (Dry Eyes).   Current Facility-Administered Medications for the 12/11/20 encounter (Office Visit) with [name], MD  Medication  . sodium chloride flush (NS) 0.9 % injection 3 mL     Allergies:   Patient has no known allergies.   Social History   Socioeconomic History  . Marital status: Married    Spouse name: Not on file  . Number of children: Not on file  . Years of education: Not on file  . Highest education level: Not on file  Occupational History  . Not on file  Tobacco Use  . Smoking status: Never Smoker  . Smokeless tobacco: Never Used  Vaping Use  . Vaping Use: Never used  Substance and Sexual Activity  . Alcohol use: Never  . Drug use: Never  . Sexual activity: Never  Other Topics Concern  . Not on file  Social History Narrative  . Not on file   Social Determinants of Health   Financial Resource Strain: Not on file  Food Insecurity: Not on file  Transportation Needs: Not on file  Physical Activity: Not on file  Stress: Not on file  Social Connections: Not on file     Family History: The patient's family history includes Diabetes in her father, paternal aunt, paternal grandfather, paternal grandmother, and paternal uncle; Hyperlipidemia in her mother; Hypertension in her mother. There is no history of Breast cancer.  ROS:   Please see the history of present illness.     All other systems reviewed and are  negative.  EKGs/Labs/Other Studies Reviewed:    The following studies were reviewed today:   EKG:  EKG is not ordered today.  The ekg ordered at prior clinic visit normal sinus rhythm, rate 75, poor R wave progression, nonspecific T wave flattening  Recent Labs: 04/19/2020: Platelets 245 04/24/2020: Hemoglobin 10.5 05/06/2020: Magnesium 1.8 09/17/2020: BUN 8; Creatinine, Ser 0.81; Potassium 4.1; Sodium 139 12/04/2020: TSH 0.613  Recent Lipid Panel    Component Value Date/Time   CHOL 194 12/04/2020 0933   TRIG 88 12/04/2020 0933   HDL 55 12/04/2020 0933   CHOLHDL 3.5 12/04/2020 0933   LDLCALC 123 (H) 12/04/2020 0933    Physical Exam:    VS:  BP 134/78   Pulse 73   Ht 5\' 4"  (1.626 m)   Wt 252 lb (114.3 kg)  SpO2 98%   BMI 43.26 kg/m     Wt Readings from Last 3 Encounters:  12/11/20 252 lb (114.3 kg)  10/22/20 257 lb (116.6 kg)  09/18/20 256 lb (116.1 kg)     GEN:  in no acute distress HEENT: Normal NECK: No JVD LYMPHATICS: No lymphadenopathy CARDIAC:RRR, no murmurs, rubs, gallops RESPIRATORY:  Clear to auscultation without rales, wheezing or rhonchi  ABDOMEN: Soft, non-tender, non-distended MUSCULOSKELETAL:  No edema; No deformity  SKIN: Warm and dry NEUROLOGIC:  Alert and oriented x 3 PSYCHIATRIC:  Normal affect   ASSESSMENT:    1. Chronic combined systolic (congestive) and diastolic (congestive) heart failure (HCC)   2. Essential hypertension   3. Hyperlipidemia, unspecified hyperlipidemia type    PLAN:    Chronic combined systolic and diastolic heart failure:  Lexiscan Myoview on 03/19/2020 showed small fixed defect at the apex, no evidence of ischemia but was poor quality study with low cardiac counts compared to extracardiac activity.  EF 31%.  TTE on 04/02/2020 showed basal inferior/inferoseptal akinesis with LVEF 35 to 40%, grade 2 diastolic dysfunction, moderate LV dilatation, no significant valvular disease, IVC small/collapsible.  RHC/LHC on 04/24/2020  showed normal coronary arteries, RA 7, RV 46/7, PA 36/15/26, PWCP 22, LVEDP 32, CI 4.5.  Cardiac MRI on 08/13/2020 showed LVEF 37%, normal RV function, mid wall LGE. -Continue Entresto 24-26 mg twice daily.  Will check BMP.  If stable creatinine/potassium, plan to increase Entresto to 49-51 mg twice daily -Continue toprol XL 200 mg daily -Continue spironolactone 12.5 mg daily -Continue as needed Lasix.  Appears euvolemic.  Advised to monitor daily weights and take Lasix if gains more than 3 pounds in 1 day or 5 pounds in one week -Will discuss starting SGLT2 inhibitor with her endocrinologist -Repeat echocardiogram prior to next clinic visit  Hypertension: On Entresto, Aldactone, Toprol-XL.  Appears well controlled  Type 2 diabetes: On Lantus.  A1c 10.1.  Follows with endocrinology.  Would recommend starting SGLT2 inhibitor  Hyperlipidemia: Continue atorvastatin 40 mg daily.  LDL 123 on 12/04/2020  Anemia: Hemoglobin 10.4 on 04/19/2020.   Iron studies consistent with iron deficiency anemia, started on iron supplementation.  Reports heavy menstrual bleeding, otherwise denies any bleeding issues  RTC in 3 months  Medication Adjustments/Labs and Tests Ordered: Current medicines are reviewed at length with the patient today.  Concerns regarding medicines are outlined above.  Orders Placed This Encounter  Procedures  . Basic metabolic panel  . Magnesium  . ECHOCARDIOGRAM COMPLETE   No orders of the defined types were placed in this encounter.   Patient Instructions  Medication Instructions:  Discuss Marcelline DeistFarxiga with your endocrinologist  *If you need a refill on your cardiac medications before your next appointment, please call your pharmacy*   Lab Work: BMET, Mag today  If you have labs (blood work) drawn today and your tests are completely normal, you will receive your results only by: Marland Kitchen. MyChart Message (if you have MyChart) OR . A paper copy in the mail If you have any lab test that  is abnormal or we need to change your treatment, we will call you to review the results.   Testing/Procedures: Your physician has requested that you have an echocardiogram in 3 MONTHS (April 2022). Echocardiography is a painless test that uses sound waves to create images of your heart. It provides your doctor with information about the size and shape of your heart and how well your heart's chambers and valves are working. This procedure takes  approximately one hour. There are no restrictions for this procedure.  This will be done at our [facility] location:  [address]  Follow-Up: At [facility], you and your health needs are our priority.  As part of our continuing mission to provide you with exceptional heart care, we have created designated Provider Care Teams.  These Care Teams include your primary Cardiologist (physician) and Advanced Practice Providers (APPs -  Physician Assistants and Nurse Practitioners) who all work together to provide you with the care you need, when you need it.  We recommend signing up for the patient portal called "MyChart".  Sign up information is provided on this After Visit Summary.  MyChart is used to connect with patients for Virtual Visits (Telemedicine).  Patients are able to view lab/test results, encounter notes, upcoming appointments, etc.  Non-urgent messages can be sent to your provider as well.   To learn more about what you can do with MyChart, go to [email or web address].    Your next appointment:   3 month(s) -after echo completed  The format for your next appointment:   In Person  Provider:   Epifanio Lesches, MD       Signed, [name], MD  12/11/2020 9:55 AM    [facility]

## 2020-12-11 ENCOUNTER — Ambulatory Visit (INDEPENDENT_AMBULATORY_CARE_PROVIDER_SITE_OTHER): Payer: Medicaid Other | Admitting: Cardiology

## 2020-12-11 ENCOUNTER — Encounter: Payer: Self-pay | Admitting: Cardiology

## 2020-12-11 ENCOUNTER — Other Ambulatory Visit: Payer: Self-pay

## 2020-12-11 VITALS — BP 134/78 | HR 73 | Ht 64.0 in | Wt 252.0 lb

## 2020-12-11 DIAGNOSIS — E785 Hyperlipidemia, unspecified: Secondary | ICD-10-CM

## 2020-12-11 DIAGNOSIS — I10 Essential (primary) hypertension: Secondary | ICD-10-CM | POA: Diagnosis not present

## 2020-12-11 DIAGNOSIS — I5042 Chronic combined systolic (congestive) and diastolic (congestive) heart failure: Secondary | ICD-10-CM

## 2020-12-11 LAB — BASIC METABOLIC PANEL
BUN/Creatinine Ratio: 9 (ref 9–23)
BUN: 7 mg/dL (ref 6–24)
CO2: 25 mmol/L (ref 20–29)
Calcium: 9.1 mg/dL (ref 8.7–10.2)
Chloride: 101 mmol/L (ref 96–106)
Creatinine, Ser: 0.75 mg/dL (ref 0.57–1.00)
GFR calc Af Amer: 111 mL/min/{1.73_m2} (ref >59)
GFR calc non Af Amer: 97 mL/min/{1.73_m2} (ref >59)
Glucose: 110 mg/dL — ABNORMAL HIGH (ref 65–99)
Potassium: 4 mmol/L (ref 3.5–5.2)
Sodium: 139 mmol/L (ref 134–144)

## 2020-12-11 LAB — MAGNESIUM: Magnesium: 1.6 mg/dL (ref 1.6–2.3)

## 2020-12-11 NOTE — Patient Instructions (Signed)
Medication Instructions:  Discuss Marcelline Deist with your endocrinologist  *If you need a refill on your cardiac medications before your next appointment, please call your pharmacy*   Lab Work: BMET, Mag today  If you have labs (blood work) drawn today and your tests are completely normal, you will receive your results only by: Marland Kitchen MyChart Message (if you have MyChart) OR . A paper copy in the mail If you have any lab test that is abnormal or we need to change your treatment, we will call you to review the results.   Testing/Procedures: Your physician has requested that you have an echocardiogram in 3 MONTHS (April 2022). Echocardiography is a painless test that uses sound waves to create images of your heart. It provides your doctor with information about the size and shape of your heart and how well your heart's chambers and valves are working. This procedure takes approximately one hour. There are no restrictions for this procedure.  This will be done at our [facility] location:  [address]  Follow-Up: At [facility], you and your health needs are our priority.  As part of our continuing mission to provide you with exceptional heart care, we have created designated Provider Care Teams.  These Care Teams include your primary Cardiologist (physician) and Advanced Practice Providers (APPs -  Physician Assistants and Nurse Practitioners) who all work together to provide you with the care you need, when you need it.  We recommend signing up for the patient portal called "MyChart".  Sign up information is provided on this After Visit Summary.  MyChart is used to connect with patients for Virtual Visits (Telemedicine).  Patients are able to view lab/test results, encounter notes, upcoming appointments, etc.  Non-urgent messages can be sent to your provider as well.   To learn more about what you can do with MyChart, go to [email or web address].    Your next appointment:   3  month(s) -after echo completed  The format for your next appointment:   In Person  Provider:   Epifanio Lesches, MD

## 2020-12-13 ENCOUNTER — Other Ambulatory Visit: Payer: Self-pay | Admitting: Family Medicine

## 2020-12-13 DIAGNOSIS — Z1231 Encounter for screening mammogram for malignant neoplasm of breast: Secondary | ICD-10-CM

## 2020-12-14 ENCOUNTER — Other Ambulatory Visit: Payer: Self-pay | Admitting: Cardiology

## 2020-12-14 DIAGNOSIS — I420 Dilated cardiomyopathy: Secondary | ICD-10-CM

## 2020-12-16 ENCOUNTER — Other Ambulatory Visit: Payer: Self-pay | Admitting: *Deleted

## 2020-12-16 DIAGNOSIS — I5042 Chronic combined systolic (congestive) and diastolic (congestive) heart failure: Secondary | ICD-10-CM

## 2020-12-16 DIAGNOSIS — E119 Type 2 diabetes mellitus without complications: Secondary | ICD-10-CM

## 2020-12-16 DIAGNOSIS — Z79899 Other long term (current) drug therapy: Secondary | ICD-10-CM

## 2020-12-16 MED ORDER — DAPAGLIFLOZIN PROPANEDIOL 10 MG PO TABS: 10.0000 mg | ORAL_TABLET | Freq: Every day | ORAL | 3 refills | Status: DC

## 2020-12-18 ENCOUNTER — Ambulatory Visit (INDEPENDENT_AMBULATORY_CARE_PROVIDER_SITE_OTHER): Payer: Medicaid Other | Admitting: Nurse Practitioner

## 2020-12-18 ENCOUNTER — Other Ambulatory Visit: Payer: Self-pay

## 2020-12-18 ENCOUNTER — Encounter: Payer: Self-pay | Admitting: Nurse Practitioner

## 2020-12-18 VITALS — BP 101/69 | HR 68 | Ht 64.0 in | Wt 254.0 lb

## 2020-12-18 DIAGNOSIS — E1110 Type 2 diabetes mellitus with ketoacidosis without coma: Secondary | ICD-10-CM | POA: Diagnosis not present

## 2020-12-18 LAB — POCT GLYCOSYLATED HEMOGLOBIN (HGB A1C): Hemoglobin A1C: 8.8 % — AB (ref 4.0–5.6)

## 2020-12-18 LAB — POCT UA - MICROALBUMIN
Albumin/Creatinine Ratio, Urine, POC: <30
Creatinine, POC: 300 mg/dL
Microalbumin Ur, POC: 30 mg/L

## 2020-12-18 NOTE — Patient Instructions (Signed)
Diabetes Mellitus and Nutrition, Adult When you have diabetes, or diabetes mellitus, it is very important to have healthy eating habits because your blood sugar (glucose) levels are greatly affected by what you eat and drink. Eating healthy foods in the right amounts, at about the same times every day, can help you:  Control your blood glucose.  Lower your risk of heart disease.  Improve your blood pressure.  Reach or maintain a healthy weight. What can affect my meal plan? Every person with diabetes is different, and each person has different needs for a meal plan. Your health care provider may recommend that you work with a dietitian to make a meal plan that is best for you. Your meal plan may vary depending on factors such as:  The calories you need.  The medicines you take.  Your weight.  Your blood glucose, blood pressure, and cholesterol levels.  Your activity level.  Other health conditions you have, such as heart or kidney disease. How do carbohydrates affect me? Carbohydrates, also called carbs, affect your blood glucose level more than any other type of food. Eating carbs naturally raises the amount of glucose in your blood. Carb counting is a method for keeping track of how many carbs you eat. Counting carbs is important to keep your blood glucose at a healthy level, especially if you use insulin or take certain oral diabetes medicines. It is important to know how many carbs you can safely have in each meal. This is different for every person. Your dietitian can help you calculate how many carbs you should have at each meal and for each snack. How does alcohol affect me? Alcohol can cause a sudden decrease in blood glucose (hypoglycemia), especially if you use insulin or take certain oral diabetes medicines. Hypoglycemia can be a life-threatening condition. Symptoms of hypoglycemia, such as sleepiness, dizziness, and confusion, are similar to symptoms of having too much  alcohol.  Do not drink alcohol if: ? Your health care provider tells you not to drink. ? You are pregnant, may be pregnant, or are planning to become pregnant.  If you drink alcohol: ? Do not drink on an empty stomach. ? Limit how much you use to:  0-1 drink a day for women.  0-2 drinks a day for men. ? Be aware of how much alcohol is in your drink. In the [place] one drink equals one 12 oz bottle of beer (355 mL), one 5 oz glass of wine (148 mL), or one 1 oz glass of hard liquor (44 mL). ? Keep yourself hydrated with water, diet soda, or unsweetened iced tea.  Keep in mind that regular soda, juice, and other mixers may contain a lot of sugar and must be counted as carbs. What are tips for following this plan? Reading food labels  Start by checking the serving size on the "Nutrition Facts" label of packaged foods and drinks. The amount of calories, carbs, fats, and other nutrients listed on the label is based on one serving of the item. Many items contain more than one serving per package.  Check the total grams (g) of carbs in one serving. You can calculate the number of servings of carbs in one serving by dividing the total carbs by 15. For example, if a food has 30 g of total carbs per serving, it would be equal to 2 servings of carbs.  Check the number of grams (g) of saturated fats and trans fats in one serving. Choose foods that have   a low amount or none of these fats.  Check the number of milligrams (mg) of salt (sodium) in one serving. Most people should limit total sodium intake to less than 2,300 mg per day.  Always check the nutrition information of foods labeled as "low-fat" or "nonfat." These foods may be higher in added sugar or refined carbs and should be avoided.  Talk to your dietitian to identify your daily goals for nutrients listed on the label. Shopping  Avoid buying canned, pre-made, or processed foods. These foods tend to be high in fat, sodium, and added  sugar.  Shop around the outside edge of the grocery store. This is where you will most often find fresh fruits and vegetables, bulk grains, fresh meats, and fresh dairy. Cooking  Use low-heat cooking methods, such as baking, instead of high-heat cooking methods like deep frying.  Cook using healthy oils, such as olive, canola, or sunflower oil.  Avoid cooking with butter, cream, or high-fat meats. Meal planning  Eat meals and snacks regularly, preferably at the same times every day. Avoid going long periods of time without eating.  Eat foods that are high in fiber, such as fresh fruits, vegetables, beans, and whole grains. Talk with your dietitian about how many servings of carbs you can eat at each meal.  Eat 4-6 oz (112-168 g) of lean protein each day, such as lean meat, chicken, fish, eggs, or tofu. One ounce (oz) of lean protein is equal to: ? 1 oz (28 g) of meat, chicken, or fish. ? 1 egg. ?  cup (62 g) of tofu.  Eat some foods each day that contain healthy fats, such as avocado, nuts, seeds, and fish.   What foods should I eat? Fruits Berries. Apples. Oranges. Peaches. Apricots. Plums. Grapes. Mango. Papaya. Pomegranate. Kiwi. Cherries. Vegetables Lettuce. Spinach. Leafy greens, including kale, chard, collard greens, and mustard greens. Beets. Cauliflower. Cabbage. Broccoli. Carrots. Green beans. Tomatoes. Peppers. Onions. Cucumbers. Brussels sprouts. Grains Whole grains, such as whole-wheat or whole-grain bread, crackers, tortillas, cereal, and pasta. Unsweetened oatmeal. Quinoa. Brown or wild rice. Meats and other proteins Seafood. Poultry without skin. Lean cuts of poultry and beef. Tofu. Nuts. Seeds. Dairy Low-fat or fat-free dairy products such as milk, yogurt, and cheese. The items listed above may not be a complete list of foods and beverages you can eat. Contact a dietitian for more information. What foods should I avoid? Fruits Fruits canned with  syrup. Vegetables Canned vegetables. Frozen vegetables with butter or cream sauce. Grains Refined white flour and flour products such as bread, pasta, snack foods, and cereals. Avoid all processed foods. Meats and other proteins Fatty cuts of meat. Poultry with skin. Breaded or fried meats. Processed meat. Avoid saturated fats. Dairy Full-fat yogurt, cheese, or milk. Beverages Sweetened drinks, such as soda or iced tea. The items listed above may not be a complete list of foods and beverages you should avoid. Contact a dietitian for more information. Questions to ask a health care provider  Do I need to meet with a diabetes educator?  Do I need to meet with a dietitian?  What number can I call if I have questions?  When are the best times to check my blood glucose? Where to find more information:  American Diabetes Association: diabetes.org  Academy of Nutrition and Dietetics: www.eatright.org  National Institute of Diabetes and Digestive and Kidney Diseases: www.niddk.nih.gov  Association of Diabetes Care and Education Specialists: www.diabeteseducator.org Summary  It is important to have healthy eating   habits because your blood sugar (glucose) levels are greatly affected by what you eat and drink.  A healthy meal plan will help you control your blood glucose and maintain a healthy lifestyle.  Your health care provider may recommend that you work with a dietitian to make a meal plan that is best for you.  Keep in mind that carbohydrates (carbs) and alcohol have immediate effects on your blood glucose levels. It is important to count carbs and to use alcohol carefully. This information is not intended to replace advice given to you by your health care provider. Make sure you discuss any questions you have with your health care provider. Document Revised: 10/24/2019 Document Reviewed: 10/24/2019 Elsevier Patient Education  2021 [employer]  

## 2020-12-18 NOTE — Progress Notes (Signed)
12/18/2020, 3:08 PM  Endocrinology follow-up note   Subjective:    Patient ID: Connie Luna, female    DOB: [birth year].  Connie Luna is being seen in follow-up after she was seen in consultation for management of currently uncontrolled symptomatic diabetes requested by  [name], MD.   Past Medical History:  Diagnosis Date  . Diabetes mellitus without complication (HCC)   . Hypertension     Past Surgical History:  Procedure Laterality Date  . RIGHT/LEFT HEART CATH AND CORONARY ANGIOGRAPHY N/A 04/24/2020   Procedure: RIGHT/LEFT HEART CATH AND CORONARY ANGIOGRAPHY;  Surgeon: [name], MD;  Location: [facility] INVASIVE CV LAB;  Service: Cardiovascular;  Laterality: N/A;    Social History   Socioeconomic History  . Marital status: Married    Spouse name: Not on file  . Number of children: Not on file  . Years of education: Not on file  . Highest education level: Not on file  Occupational History  . Not on file  Tobacco Use  . Smoking status: Never Smoker  . Smokeless tobacco: Never Used  Vaping Use  . Vaping Use: Never used  Substance and Sexual Activity  . Alcohol use: Never  . Drug use: Never  . Sexual activity: Never  Other Topics Concern  . Not on file  Social History Narrative  . Not on file   Social Determinants of Health   Financial Resource Strain: Not on file  Food Insecurity: Not on file  Transportation Needs: Not on file  Physical Activity: Not on file  Stress: Not on file  Social Connections: Not on file    Family History  Problem Relation Age of Onset  . Hypertension Mother   . Hyperlipidemia Mother   . Diabetes Father   . Diabetes Paternal Aunt   . Diabetes Paternal Uncle   . Diabetes Paternal Grandmother   . Diabetes Paternal Grandfather   . Breast cancer Neg Hx     Outpatient Encounter Medications as of 12/18/2020  Medication Sig  . APPLE CIDER VINEGAR PO Take 2 tablets by mouth in the morning and  at bedtime. [place]  . aspirin EC 81 MG tablet Take 81 mg by mouth at bedtime.  Marland Kitchen atorvastatin (LIPITOR) 40 MG tablet Take 40 mg by mouth at bedtime.  . dapagliflozin propanediol (FARXIGA) 10 MG TABS tablet Take 1 tablet (10 mg total) by mouth daily before breakfast.  . ENTRESTO 24-26 MG TAKE 1 TABLET BY MOUTH 2 (TWO) TIMES DAILY.  . fexofenadine (ALLEGRA) 180 MG tablet Take 180 mg by mouth daily.  . fluticasone (FLONASE) 50 MCG/ACT nasal spray Place 2 sprays into both nostrils in the morning and at bedtime.  . Furosemide (LASIX PO) Take 40 mg by mouth every other day. Or As needed  . insulin glargine (LANTUS) 100 UNIT/ML Solostar Pen Inject 60 Units into the skin at bedtime.  . metFORMIN (GLUCOPHAGE-XR) 500 MG 24 hr tablet TAKE 1 TABLET BY MOUTH EVERY DAY WITH BREAKFAST  . metoprolol (TOPROL-XL) 200 MG 24 hr tablet TAKE 1 TABLET BY MOUTH EVERY DAY  . omeprazole (PRILOSEC) 20 MG capsule Take 1 capsule (20 mg total) by mouth daily. (Patient taking differently: Take 20 mg by mouth daily as needed (Reflux).)  . spironolactone (ALDACTONE) 25 MG tablet TAKE 1/2 TABLET BY MOUTH EVERY DAY  . tetrahydrozoline-zinc (VISINE-AC) 0.05-0.25 % ophthalmic solution Place 2 drops into both eyes 2 (two) times daily as needed (Dry Eyes).   Facility-Administered  Encounter Medications as of 12/18/2020  Medication  . sodium chloride flush (NS) 0.9 % injection 3 mL    ALLERGIES: No Known Allergies  VACCINATION STATUS: Immunization History  Administered Date(s) Administered  . Moderna Sars-Covid-2 Vaccination 03/02/2020, 03/30/2020    Diabetes She presents for her follow-up diabetic visit. She has type 2 diabetes mellitus. Onset time: She was diagnosed at approximate age of 34 years. She did have history of gestational diabetes when she was [age]. Her disease course has been improving. There are no hypoglycemic associated symptoms. Pertinent negatives for hypoglycemia include no confusion, headaches, pallor or  seizures. Associated symptoms include weight loss. Pertinent negatives for diabetes include no blurred vision, no chest pain, no fatigue, no polydipsia, no polyphagia and no polyuria. There are no hypoglycemic complications. Symptoms are improving. Diabetic complications include heart disease. Risk factors for coronary artery disease include dyslipidemia, diabetes mellitus, family history, obesity, hypertension and sedentary lifestyle. Current diabetic treatment includes insulin injections and oral agent (monotherapy). She is compliant with treatment most of the time. Her weight is decreasing steadily. She is following a generally healthy and diabetic (recently adopted a diabetic diet) diet. When asked about meal planning, she reported none. She has not had a previous visit with a dietitian. She rarely participates in exercise. Her home blood glucose trend is decreasing steadily. Her overall blood glucose range is 110-130 mg/dl. (She presents today with her meter, no logs, showing improved glycemic profile overall.  Her POCT A1c today is 8.8%, improving from last visit of 10.1%.  She has worked on her diet by cutting out "white" foods like breads, pastas, and Luna.  She recently saw her cardiologist last last week who started her on Farxiga and asked her to speak with us about it today.  I discussed with her the potential side effects vs benefits of the medication and how we typically do not recommend SGLT2 class medications in this office.  I discussed that there are multiple options of medications to help her control her diabetes that have less severe side effects.  However, if her cardiologist recommends it for her CHF, then she can continue that medication at her own risk.  She has no documented episodes of hypoglycemia.) An ACE inhibitor/angiotensin II receptor blocker is being taken. She does not see a podiatrist.Eye exam is current.  Hyperlipidemia This is a chronic problem. The current episode started more  than 1 year ago. The problem is controlled. Recent lipid tests were reviewed and are high. Exacerbating diseases include diabetes and obesity. Factors aggravating her hyperlipidemia include beta blockers and fatty foods. Pertinent negatives include no chest pain, myalgias or shortness of breath. Current antihyperlipidemic treatment includes statins. The current treatment provides mild improvement of lipids. Compliance problems include adherence to diet and adherence to exercise.  Risk factors for coronary artery disease include diabetes mellitus, dyslipidemia, family history, hypertension, obesity and a sedentary lifestyle.  Hypertension This is a chronic problem. The current episode started more than 1 year ago. The problem has been gradually improving since onset. The problem is controlled. Pertinent negatives include no blurred vision, chest pain, headaches, palpitations or shortness of breath. There are no associated agents to hypertension. Risk factors for coronary artery disease include diabetes mellitus, dyslipidemia, obesity, sedentary lifestyle and family history. Past treatments include beta blockers and diuretics. The current treatment provides significant improvement. There are no compliance problems.  Hypertensive end-organ damage includes heart failure.    Review of systems  Constitutional: + steadily decreasing body weight,  current Body mass index is 43.6 kg/m. , no fatigue (improved since last visit), no subjective hyperthermia, no subjective hypothermia Eyes: no blurry vision, no xerophthalmia ENT: no sore throat, no nodules palpated in throat, no dysphagia/odynophagia, no hoarseness Cardiovascular: no chest pain, no shortness of breath, no palpitations, no leg swelling Respiratory: no cough, no shortness of breath Gastrointestinal: no nausea/vomiting/diarrhea Musculoskeletal: no muscle/joint aches Skin: no rashes, no hyperemia Neurological: no tremors, no numbness, no tingling, no  dizziness Psychiatric: no depression, no anxiety  Objective:    BP 101/69   Pulse 68   Ht 5\' 4"  (1.626 m)   Wt 254 lb (115.2 kg)   BMI 43.60 kg/m   Wt Readings from Last 3 Encounters:  12/18/20 254 lb (115.2 kg)  12/11/20 252 lb (114.3 kg)  10/22/20 257 lb (116.6 kg)    BP Readings from Last 3 Encounters:  12/18/20 101/69  12/11/20 134/78  10/22/20 106/72    Physical Exam- Limited  Constitutional:  Body mass index is 43.6 kg/m. , not in acute distress, normal state of mind Eyes:  EOMI, no exophthalmos, wears glasses Neck: Supple Cardiovascular: RRR, no murmers, rubs, or gallops, no edema Respiratory: Adequate breathing efforts, no crackles, rales, rhonchi, or wheezing Musculoskeletal: no gross deformities, strength intact in all four extremities, no gross restriction of joint movements Skin:  no rashes, no hyperemia Neurological: no tremor with outstretched hands   CMP ( most recent) CMP     Component Value Date/Time   NA 139 12/11/2020 1005   K 4.0 12/11/2020 1005   CL 101 12/11/2020 1005   CO2 25 12/11/2020 1005   GLUCOSE 110 (H) 12/11/2020 1005   GLUCOSE 218 (H) 02/27/2020 0458   BUN 7 12/11/2020 1005   CREATININE 0.75 12/11/2020 1005   CALCIUM 9.1 12/11/2020 1005   GFRNONAA 97 12/11/2020 1005   GFRAA 111 12/11/2020 1005     Diabetic Labs (most recent): Lab Results  Component Value Date   HGBA1C 8.8 (A) 12/18/2020   HGBA1C 10.1 (A) 09/03/2020   HGBA1C 9.1 (H) 04/19/2020     Lipid Panel ( most recent) Lipid Panel     Component Value Date/Time   CHOL 194 12/04/2020 0933   TRIG 88 12/04/2020 0933   HDL 55 12/04/2020 0933   CHOLHDL 3.5 12/04/2020 0933   LDLCALC 123 (H) 12/04/2020 0933   LABVLDL 16 12/04/2020 0933      Lab Results  Component Value Date   TSH 0.613 12/04/2020   TSH 1.790 04/19/2020   FREET4 1.17 12/04/2020      Assessment & Plan:   1) DM (diabetes mellitus) type 2, uncontrolled, with ketoacidosis (HCC)  - Connie Luna has currently uncontrolled symptomatic type 2 DM since  45 years of age.  She presents today with her meter, no logs, showing improved glycemic profile overall.  Her POCT A1c today is 8.8%, improving from last visit of 10.1%.  She has worked on her diet by cutting out "white" foods like breads, pastas, and Luna.  She has no documented episodes of hypoglycemia.  She says she did fall back into old habits during the holidays but has gotten back on track with her diet since then.  She recently saw her cardiologist last last week who started her on Farxiga and asked her to speak with [birth year]2002 about it today.  I discussed with her the potential side effects vs benefits of the medication and how we typically do not recommend SGLT2 class medications in this office.  I discussed that there are multiple options of medications to help her control her diabetes that have less severe side effects.  However, if her cardiologist recommends it for her CHF, then she can continue that medication at her own risk under his guidance.   Recent labs reviewed.  - I had a long discussion with her about the progressive nature of diabetes and the pathology behind its complications. -her diabetes is complicated by obesity/sedentary life and she remains at a high risk for more acute and chronic complications which include CAD, CVA, CKD, retinopathy, and neuropathy. These are all discussed in detail with her.  - Nutritional counseling repeated at each appointment due to patients tendency to fall back in to old habits.  - The patient admits there is a room for improvement in their diet and drink choices. -  Suggestion is made for the patient to avoid simple carbohydrates from their diet including Cakes, Sweet Desserts / Pastries, Ice Cream, Soda (diet and regular), Sweet Tea, Candies, Chips, Cookies, Sweet Pastries,  Store Bought Juices, Alcohol in Excess of  1-2 drinks a day, Artificial Sweeteners, Coffee Creamer, and "Sugar-free"  Products. This will help patient to have stable blood glucose profile and potentially avoid unintended weight gain.   - I encouraged the patient to switch to  unprocessed or minimally processed complex starch and increased protein intake (animal or plant source), fruits, and vegetables.   - Patient is advised to stick to a routine mealtimes to eat 3 meals  a day and avoid unnecessary snacks ( to snack only to correct hypoglycemia).  - she will be scheduled with Norm SaltPenny Crumpton, RDN, CDE for diabetes education.  - I have approached her with the following individualized plan to manage  her diabetes and patient agrees:   -In light of her presentation with significant glycemic improvement she will not need prandial insulin for now.    -Her glucose continues to improve over time with diet and recent medication changes by us and cardiologist.  She just recently started her Farxiga 10 mg po daily, therefore no further changes will be made to her DM medication regimen at this time until we know how her glucose will respond, to avoid inadvertent hypoglycemia. She is advised to continue Lantus 60 units nightly along with her Metformin 500 mg ER daily with breakfast.  -She is encouraged to monitor blood glucose twice daily, before breakfast and before bed, and to call the clinic if she has readings less than 70 or greater than 300 for 3 tests in a row.   - Specific targets for  A1c;  LDL, HDL,  and Triglycerides were discussed with the patient.  2) Blood Pressure /Hypertension:   Her blood pressure is controlled to target.   she is advised to continue her current medications including Metoprolol 200 mg XL po daily, Entresto 24-26 mg p.o. twice daily, Lasix 40 mg po every other day, and Spironolactone 12.5 mg po daily  3) Lipids/Hyperlipidemia:   Her most recent lipid panel from 12/04/20 shows uncontrolled LDL at 123.  She is advised to continue Lipitor 40 mg po daily at bedtime along with working on her diet  and exercise.  Side effects and precautions discussed with her.  4)  Weight/Diet: Her Body mass index is 43.6 kg/m.  -   clearly complicating her diabetes care.   she is a candidate for weight loss. I discussed with her the fact that loss of 5 - 10% of her  current body weight  will have the most impact on her diabetes management.  Exercise, and detailed carbohydrates information provided  -  detailed on discharge instructions.  5) Chronic Care/Health Maintenance: -she is on ACEI/ARB and Statin medications and is encouraged to initiate and continue to follow up with Ophthalmology, Dentist, Podiatrist at least yearly or according to recommendations, and advised to stay away from smoking. I have recommended yearly flu vaccine and pneumonia vaccine at least every 5 years; moderate intensity exercise for up to 150 minutes weekly; and  sleep for at least 7 hours a day.    - she is advised to maintain close follow up with [name], MD for primary care needs, as well as her other providers for optimal and coordinated care.   - Time spent on this patient care encounter:  35 min, of which > 50% was spent in  counseling and the rest reviewing her blood glucose logs , discussing her hypoglycemia and hyperglycemia episodes, reviewing her current and  previous labs / studies  ( including abstraction from other facilities) and medications  doses and developing a  long term treatment plan and documenting her care.   Please refer to Patient Instructions for Blood Glucose Monitoring and Insulin/Medications Dosing Guide"  in media tab for additional information. Please  also refer to " Patient Self Inventory" in the Media  tab for reviewed elements of pertinent patient history.  Connie Luna participated in the discussions, expressed understanding, and voiced agreement with the above plans.  All questions were answered to her satisfaction. she is encouraged to contact clinic should she have any questions or  concerns prior to her return visit.   Follow up plan: - Return in about 3 months (around 03/18/2021) for Diabetes follow up with A1c in office, No previsit labs, Bring glucometer and logs.  Ronny Bacon, [facility] [address] [place], [place] [address] Phone: [phone] Fax: [phone]  12/18/2020, 3:08 PM

## 2020-12-20 IMAGING — MR MR CARD MORPHOLOGY WO/W CM
43 of 45 series · 43 of 45 positions shown · IV contrast (gadavist)
Comparison: none

CLINICAL DATA: Cardiomyopathy, follow up

EXAM:
CARDIAC MRI
TECHNIQUE: The patient was scanned on a 1.5 Tesla GE magnet. A dedicated
cardiac coil was used. Functional imaging was done using Fiesta
sequences. [DATE], and 4 chamber views were done to assess for RWMA's.
Modified Kushal rule using a short axis stack was used to
calculate an ejection fraction on a dedicated work station using
Circle software. The patient received 10mL GADAVIST GADOBUTROL 1
MMOL/ML IV SOLN. After 10 minutes inversion recovery sequences were
used to assess for infiltration and scar tissue.

[Series 4: t2_[id number]_bh · axial · 8.0mm · 1.56mm/px · 1 of 16 slices shown]
[im 1/16]
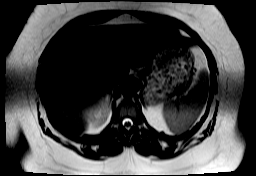

[Series 8: bSSFP · oblique · 8.0mm · 1.61mm/px · 1 of 25 slices shown (1 of 21)]
[im 1/25]
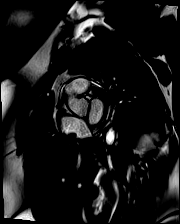

[Series 9: bSSFP · oblique · 8.0mm · 1.61mm/px · 1 of 25 slices shown (2 of 21)]
[im 1/25]
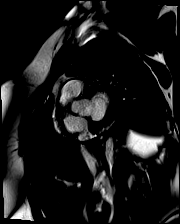

[Series 10: bSSFP · oblique · 8.0mm · 1.61mm/px · 1 of 25 slices shown (3 of 21)]
[im 1/25]
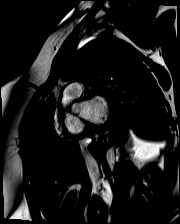

[Series 11: bSSFP · oblique · 8.0mm · 1.61mm/px · 1 of 25 slices shown (4 of 21)]
[im 1/25]
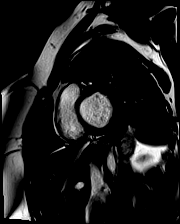

[Series 12: bSSFP · oblique · 8.0mm · 1.61mm/px · 1 of 25 slices shown (5 of 21)]
[im 1/25]
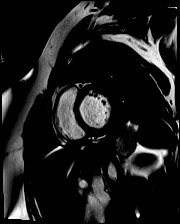

[Series 13: bSSFP · oblique · 8.0mm · 1.61mm/px · 1 of 25 slices shown (6 of 21)]
[im 1/25]
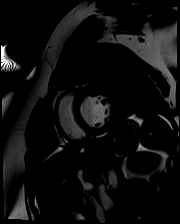

[Series 14: bSSFP · oblique · 8.0mm · 1.61mm/px · 1 of 25 slices shown (7 of 21)]
[im 1/25]
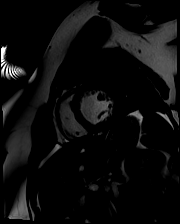

[Series 15: bSSFP · oblique · 8.0mm · 1.61mm/px · 1 of 25 slices shown (8 of 21)]
[im 1/25]
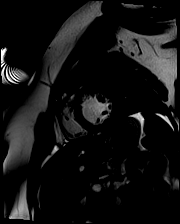

[Series 16: bSSFP · oblique · 8.0mm · 1.61mm/px · 1 of 25 slices shown (9 of 21)]
[im 1/25]
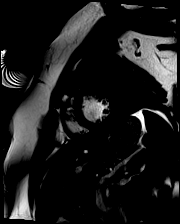

[Series 17: bSSFP · oblique · 8.0mm · 1.61mm/px · 1 of 25 slices shown (10 of 21)]
[im 1/25]
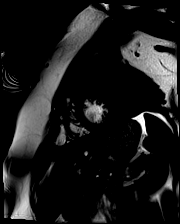

[Series 18: bSSFP · oblique · 8.0mm · 1.61mm/px · 1 of 25 slices shown (11 of 21)]
[im 1/25]
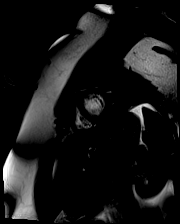

[Series 19: bSSFP · oblique · 8.0mm · 1.61mm/px · 1 of 25 slices shown (12 of 21)]
[im 1/25]
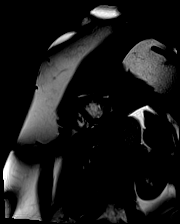

[Series 20: bSSFP · oblique · 8.0mm · 1.61mm/px · 1 of 25 slices shown (13 of 21)]
[im 1/25]
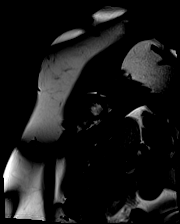

[Series 21: bSSFP · oblique · 8.0mm · 1.61mm/px · 1 of 25 slices shown (14 of 21)]
[im 1/25]
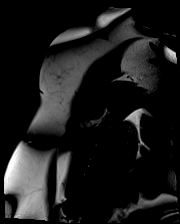

[Series 22: bSSFP · oblique · 8.0mm · 1.61mm/px · 1 of 25 slices shown (15 of 21)]
[im 1/25]
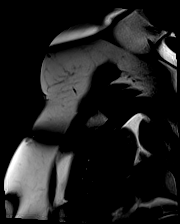

[Series 23: bSSFP · oblique · 8.0mm · 1.61mm/px · 1 of 25 slices shown (16 of 21)]
[im 1/25]
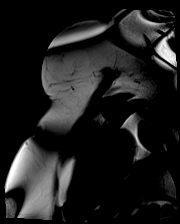

[Series 24: bSSFP · oblique · 8.0mm · 1.61mm/px · 1 of 25 slices shown (17 of 21)]
[im 1/25]
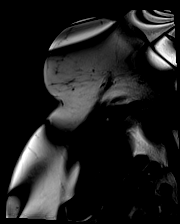

[Series 25: bSSFP · oblique · 6.0mm · 1.41mm/px · 1 of 25 slices shown (18 of 21)]
[im 1/25]
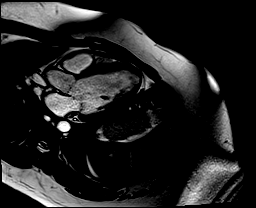

[Series 26: bSSFP · sagittal · 6.0mm · 1.41mm/px · 1 of 25 slices shown (19 of 21)]
[im 1/25]
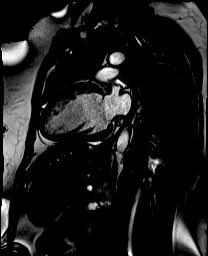

[Series 27: bSSFP · axial · 6.0mm · 1.41mm/px · 1 of 25 slices shown (20 of 21)]
[im 1/25]
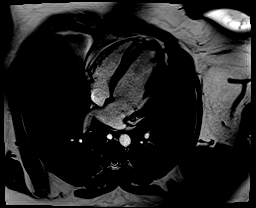

[Series 28: (id)_long_t1 · coronal · 8.0mm · 1.56mm/px · 1 of 24 slices shown]
[im 1/24]
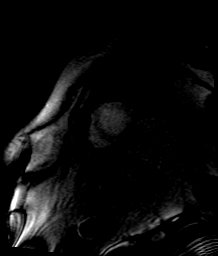

[Series 29: (id)_long_t1_moco · coronal · 8.0mm · 1.56mm/px · 1 of 24 slices shown]
[im 1/24]
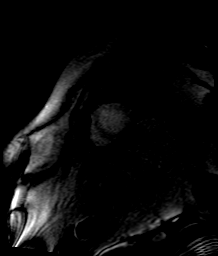

[Series 30: (id)_long_t1_moco_t1 · coronal · 8.0mm · 1.56mm/px · 1 of 6 slices shown]
[im 1/6]
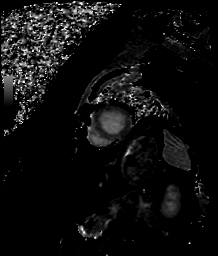

[Series 32: (id)_trufi · coronal · 8.0mm · 1.88mm/px · 1 of 9 slices shown]
[im 1/9]
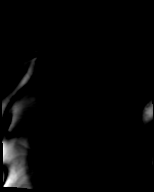

[Series 33: (id)_trufi_moco · coronal · 8.0mm · 1.88mm/px · 1 of 9 slices shown]
[im 1/9]
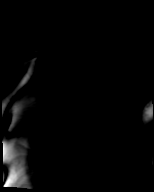

[Series 34: (id)_trufi_moco_t2 · coronal · 8.0mm · 1.88mm/px · 1 of 3 slices shown]
[im 1/3]
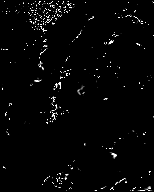

[Series 36: STIR · oblique · 8.0mm · 1.92mm/px · 1 of 15 slices shown]
[im 1/15]
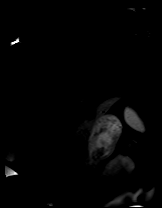

[Series 37: bSSFP · coronal · 6.0mm · 1.41mm/px · 1 of 25 slices shown (21 of 21)]
[im 1/25]
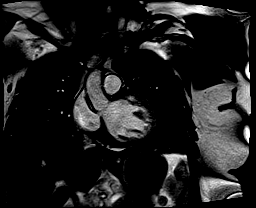

[Series 38: cine rvit · oblique · 6.0mm · 1.41mm/px · 1 of 25 slices shown]
[im 1/25]
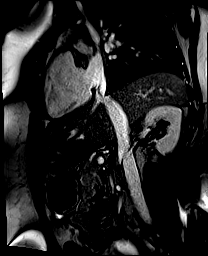

[Series 39: aortic valve cine · oblique · 6.0mm · 1.41mm/px · 1 of 25 slices shown]
[im 1/25]
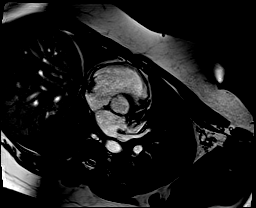

[Series 40: cine rvot · sagittal · 6.0mm · 1.41mm/px · 1 of 25 slices shown]
[im 1/25]
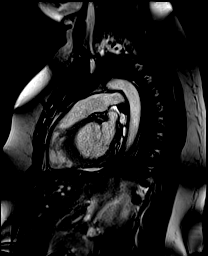

[Series 42: lge_single shot sa · oblique · 8.0mm · 2.14mm/px · 1 of 17 slices shown (1 of 2)]
[im 1/17]
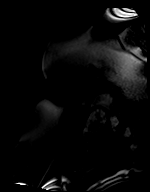

[Series 43: lge_single shot sa · oblique · 8.0mm · 2.14mm/px · 1 of 17 slices shown (2 of 2)]
[im 1/17]
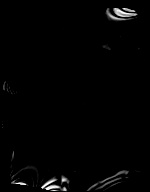

[Series 46: lge_single shot 4 · axial · 6.0mm · 2.14mm/px · 1 of 1 slices shown (1 of 2)]
[im 1/1]
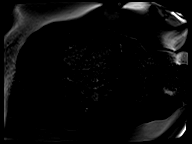

[Series 47: lge_single shot 4 · axial · 6.0mm · 2.14mm/px · 1 of 1 slices shown (2 of 2)]
[im 1/1]
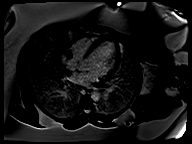

[Series 53: (id)_short_t1 · coronal · 8.0mm · 1.56mm/px · 1 of 27 slices shown]
[im 1/27]
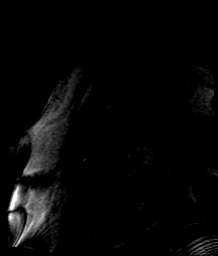

[Series 54: (id)_short_t1_moco · coronal · 8.0mm · 1.56mm/px · 1 of 27 slices shown]
[im 1/27]
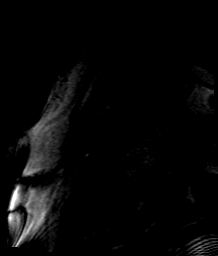

[Series 55: (id)_short_t1_moco_t1 · coronal · 8.0mm · 1.56mm/px · 1 of 6 slices shown]
[im 1/6]
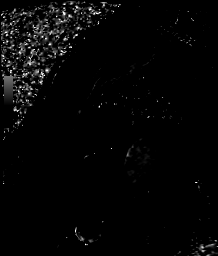

[Series 58: lge short axis · oblique · 8.0mm · 1.71mm/px · 1 of 17 slices shown (1 of 2)]
[im 1/17]
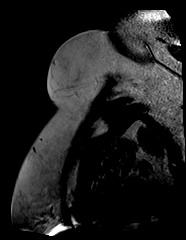

[Series 59: lge short axis · oblique · 8.0mm · 1.71mm/px · 1 of 17 slices shown (2 of 2)]
[im 1/17]
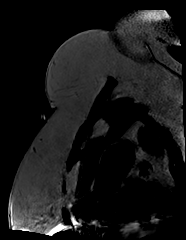

[Series 60: lge hi res · axial · 6.0mm · 1.71mm/px · 1 of 1 slices shown (1 of 2)]
[im 1/1]
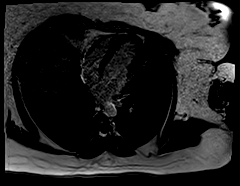

[Series 61: lge hi res · axial · 6.0mm · 1.71mm/px · 1 of 1 slices shown (2 of 2)]
[im 1/1]
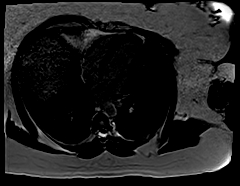

[43 of 45 positions shown; findings below may reference images not displayed]

FINDINGS: LEFT VENTRICLE:

Normal left ventricular chamber size by indexed volumetric
assessment.

Normal left ventricular wall thickness.

Moderately reduced left ventricular systolic function.

LVEF = 37%

Global hypokinesis.

Midmyocardial stripe of delayed enhancement. This can be seen in
nonischemic cardiomyopathy and is nonspecific.

Normal T1 myocardial nulling kinetics suggest against a diagnosis of
cardiac amyloidosis.

ECV = 36%

T2: 50 ms

RIGHT VENTRICLE:

Normal right ventricular chamber size.

Normal right ventricular wall thickness.

Normal right ventricular systolic function.

RVEF = 52%

There are no regional wall motion abnormalities.

No post contrast delayed myocardial enhancement.

ATRIA:

Mild left atrial chamber enlargement.

Normal right atrial size.

VALVES:

No significant valvular abnormalities.

PERICARDIUM:

Normal pericardium.  Trace pericardial effusion.

OTHER: No significant extracardiac findings.

MEASUREMENTS:
Left ventricle:

LV Female

LV EF: 37% (normal 56-78%)

LVEDD 53 mm, LVESD 43 mm

Absolute volumes:

LV EDV: 143mL (Normal 52-141 mL)

LV ESV: 90mL (Normal 13-51 mL)

LV SV: 52mL (Normal 33-97 mL)

CO: 4.0L/min (normal 2.7-6.0 L/min)

Indexed volumes:

LV EDV: 63mL/sq-m (Normal 41-81 mL/sq-m)

LV ESV: 40mL/sq-m (Normal 12-21 mL/sq-m)

LV SV: 23mL/sq-m (Normal 26-56 mL/sq-m)

CI: 1.77L/min/sq-m (Normal 1.8-3.8 L/min/sq-m)

Right ventricle:

RV female

RV EF: 52% (Normal 47-80%)

Absolute volumes:

RV EDV: 102 mL (Normal 58-154 mL)

RV ESV: 49 mL (Normal 12-68 mL)

RV SV: 53 mL (Normal 35-98 mL)

CO: 4.1 L/min (Normal 2.7-6 L/min)

Indexed volumes:

RV EDV: 45 ML/sq-m (Normal 48-87 mL/sq-m)

RV ESV: 22 mL/sq-m (Normal 11-28 mL/sq-m)

RV SV: 23.5 mL/sq-m (Normal 27-57 mL/sq-m)

CI: 1.81 L/min/sq-m (Normal 1.8-3.8 L/min/sq-m)
IMPRESSION: 1. Normal left ventricular size by indexed volume. Moderately
reduced systolic function. LVEF 37%.

2.  Normal right ventricular chamber size and systolic function.

3. Nonspecific delayed myocardial enhancement. Midmyocardial stripe
of delay enhancement circumferentially. ECV 36%. No myocardial
edema.

## 2021-01-23 ENCOUNTER — Other Ambulatory Visit: Payer: Self-pay | Admitting: Family Medicine

## 2021-01-23 DIAGNOSIS — R5381 Other malaise: Secondary | ICD-10-CM

## 2021-01-27 ENCOUNTER — Other Ambulatory Visit: Payer: Self-pay | Admitting: Cardiology

## 2021-01-27 ENCOUNTER — Inpatient Hospital Stay: Admission: RE | Admit: 2021-01-27 | Payer: Medicaid Other | Source: Ambulatory Visit

## 2021-02-11 ENCOUNTER — Other Ambulatory Visit: Payer: Self-pay | Admitting: *Deleted

## 2021-02-11 DIAGNOSIS — Z794 Long term (current) use of insulin: Secondary | ICD-10-CM

## 2021-02-11 DIAGNOSIS — I5042 Chronic combined systolic (congestive) and diastolic (congestive) heart failure: Secondary | ICD-10-CM

## 2021-02-11 DIAGNOSIS — Z79899 Other long term (current) drug therapy: Secondary | ICD-10-CM

## 2021-02-12 LAB — BASIC METABOLIC PANEL
BUN/Creatinine Ratio: 9 (ref 9–23)
BUN: 10 mg/dL (ref 6–24)
CO2: 22 mmol/L (ref 20–29)
Calcium: 9.5 mg/dL (ref 8.7–10.2)
Chloride: 103 mmol/L (ref 96–106)
Creatinine, Ser: 1.09 mg/dL — ABNORMAL HIGH (ref 0.57–1.00)
Glucose: 77 mg/dL (ref 65–99)
Potassium: 4.3 mmol/L (ref 3.5–5.2)
Sodium: 142 mmol/L (ref 134–144)
eGFR: 64 mL/min/{1.73_m2} (ref >59)

## 2021-02-14 ENCOUNTER — Other Ambulatory Visit: Payer: Self-pay | Admitting: *Deleted

## 2021-02-14 DIAGNOSIS — Z79899 Other long term (current) drug therapy: Secondary | ICD-10-CM

## 2021-02-14 DIAGNOSIS — I5042 Chronic combined systolic (congestive) and diastolic (congestive) heart failure: Secondary | ICD-10-CM

## 2021-02-14 MED ORDER — FUROSEMIDE 40 MG PO TABS: 40.0000 mg | ORAL_TABLET | ORAL | 3 refills | Status: DC | PRN

## 2021-02-28 ENCOUNTER — Other Ambulatory Visit: Payer: Self-pay | Admitting: "Endocrinology

## 2021-03-11 ENCOUNTER — Other Ambulatory Visit: Payer: Self-pay

## 2021-03-11 ENCOUNTER — Ambulatory Visit (HOSPITAL_COMMUNITY): Payer: Medicaid Other | Attending: Internal Medicine

## 2021-03-11 DIAGNOSIS — I5042 Chronic combined systolic (congestive) and diastolic (congestive) heart failure: Secondary | ICD-10-CM | POA: Diagnosis not present

## 2021-03-11 LAB — ECHOCARDIOGRAM COMPLETE
Area-P 1/2: 2.76 cm2
S' Lateral: 4.1 cm

## 2021-03-18 ENCOUNTER — Other Ambulatory Visit: Payer: Self-pay

## 2021-03-18 ENCOUNTER — Encounter: Payer: Self-pay | Admitting: Nurse Practitioner

## 2021-03-18 ENCOUNTER — Ambulatory Visit (INDEPENDENT_AMBULATORY_CARE_PROVIDER_SITE_OTHER): Payer: Medicaid Other | Admitting: Nurse Practitioner

## 2021-03-18 VITALS — BP 148/87 | HR 63 | Ht 64.0 in | Wt 256.8 lb

## 2021-03-18 DIAGNOSIS — E1110 Type 2 diabetes mellitus with ketoacidosis without coma: Secondary | ICD-10-CM

## 2021-03-18 DIAGNOSIS — E782 Mixed hyperlipidemia: Secondary | ICD-10-CM | POA: Diagnosis not present

## 2021-03-18 DIAGNOSIS — I10 Essential (primary) hypertension: Secondary | ICD-10-CM | POA: Diagnosis not present

## 2021-03-18 LAB — POCT GLYCOSYLATED HEMOGLOBIN (HGB A1C): HbA1c, POC (controlled diabetic range): 6.7 % (ref 0.0–7.0)

## 2021-03-18 MED ORDER — FLUTICASONE PROPIONATE 50 MCG/ACT NA SUSP: 2.0000 | Freq: Two times a day (BID) | NASAL | 12 refills | Status: AC

## 2021-03-18 NOTE — Progress Notes (Signed)
03/18/2021, 2:39 PM  Endocrinology follow-up note   Subjective:    Patient ID: Connie Luna, female    DOB: 1975/12/03.  Connie Luna is being seen in follow-up after she was seen in consultation for management of currently uncontrolled symptomatic diabetes requested by  [name], MD.   Past Medical History:  Diagnosis Date  . Diabetes mellitus without complication (HCC)   . Hypertension     Past Surgical History:  Procedure Laterality Date  . RIGHT/LEFT HEART CATH AND CORONARY ANGIOGRAPHY N/A 04/24/2020   Procedure: RIGHT/LEFT HEART CATH AND CORONARY ANGIOGRAPHY;  Surgeon: [name], MD;  Location: [facility] INVASIVE CV LAB;  Service: Cardiovascular;  Laterality: N/A;    Social History   Socioeconomic History  . Marital status: Married    Spouse name: Not on file  . Number of children: Not on file  . Years of education: Not on file  . Highest education level: Not on file  Occupational History  . Not on file  Tobacco Use  . Smoking status: Never Smoker  . Smokeless tobacco: Never Used  Vaping Use  . Vaping Use: Never used  Substance and Sexual Activity  . Alcohol use: Never  . Drug use: Never  . Sexual activity: Never  Other Topics Concern  . Not on file  Social History Narrative  . Not on file   Social Determinants of Health   Financial Resource Strain: Not on file  Food Insecurity: Not on file  Transportation Needs: Not on file  Physical Activity: Not on file  Stress: Not on file  Social Connections: Not on file    Family History  Problem Relation Age of Onset  . Hypertension Mother   . Hyperlipidemia Mother   . Diabetes Father   . Diabetes Paternal Aunt   . Diabetes Paternal Uncle   . Diabetes Paternal Grandmother   . Diabetes Paternal Grandfather   . Breast cancer Neg Hx     Outpatient Encounter Medications as of 03/18/2021  Medication Sig  . APPLE CIDER VINEGAR PO Take 2 tablets by mouth in the morning and  at bedtime. [place]  . aspirin EC 81 MG tablet Take 81 mg by mouth at bedtime.  Marland Kitchen atorvastatin (LIPITOR) 40 MG tablet Take 40 mg by mouth at bedtime.  . dapagliflozin propanediol (FARXIGA) 10 MG TABS tablet Take 1 tablet (10 mg total) by mouth daily before breakfast.  . ENTRESTO 24-26 MG TAKE 1 TABLET BY MOUTH 2 (TWO) TIMES DAILY.  . fexofenadine (ALLEGRA) 180 MG tablet Take 180 mg by mouth daily.  . insulin glargine (LANTUS) 100 UNIT/ML Solostar Pen Inject 60 Units into the skin at bedtime.  . metFORMIN (GLUCOPHAGE-XR) 500 MG 24 hr tablet TAKE 1 TABLET BY MOUTH EVERY DAY WITH BREAKFAST  . metoprolol (TOPROL-XL) 200 MG 24 hr tablet TAKE 1 TABLET BY MOUTH EVERY DAY  . omeprazole (PRILOSEC) 20 MG capsule Take 1 capsule (20 mg total) by mouth daily. (Patient taking differently: Take 20 mg by mouth daily as needed (Reflux).)  . spironolactone (ALDACTONE) 25 MG tablet TAKE 1/2 TABLET BY MOUTH EVERY DAY  . tetrahydrozoline-zinc (VISINE-AC) 0.05-0.25 % ophthalmic solution Place 2 drops into both eyes 2 (two) times daily as needed (Dry Eyes).  . [DISCONTINUED] fluticasone (FLONASE) 50 MCG/ACT nasal spray Place 2 sprays into both nostrils in the morning and at bedtime.  . [DISCONTINUED] furosemide (LASIX) 40 MG tablet Take 1 tablet (40 mg total) by mouth as needed (  weight increase of 3 lbs overnight or 5 lbs in 1 week).  . fluticasone (FLONASE) 50 MCG/ACT nasal spray Place 2 sprays into both nostrils in the morning and at bedtime.   Facility-Administered Encounter Medications as of 03/18/2021  Medication  . sodium chloride flush (NS) 0.9 % injection 3 mL    ALLERGIES: No Known Allergies  VACCINATION STATUS: Immunization History  Administered Date(s) Administered  . Moderna Sars-Covid-2 Vaccination 03/02/2020, 03/30/2020    Diabetes She presents for her follow-up diabetic visit. She has type 2 diabetes mellitus. Onset time: She was diagnosed at approximate age of 45 years. She did have history of  gestational diabetes when she was 45. Her disease course has been improving. There are no hypoglycemic associated symptoms. Pertinent negatives for hypoglycemia include no confusion, headaches, pallor or seizures. Pertinent negatives for diabetes include no blurred vision, no chest pain, no fatigue, no polydipsia, no polyphagia, no polyuria and no weight loss. There are no hypoglycemic complications. Symptoms are improving. Diabetic complications include heart disease and nephropathy. Risk factors for coronary artery disease include dyslipidemia, diabetes mellitus, family history, obesity, hypertension and sedentary lifestyle. Current diabetic treatment includes insulin injections and oral agent (dual therapy). She is compliant with treatment most of the time. Her weight is fluctuating minimally. She is following a generally healthy and diabetic (recently adopted a diabetic diet) diet. When asked about meal planning, she reported none. She has not had a previous visit with a dietitian. She rarely participates in exercise. Her home blood glucose trend is decreasing steadily. Her breakfast blood glucose range is generally 90-110 mg/dl. (She presents today with her meter and logs showing improved fasting glycemic profile, near target.  Her POCT A1c today is 6.7%, improving from previous visit of 8.8%.  She is continuing to take her [place] under the guidance of her cardiologist.  There are no episodes of hypoglycemia noted.  She reports she may be moving to [place] in the coming months to pursue her career.) An ACE inhibitor/angiotensin II receptor blocker is being taken. She does not see a podiatrist.Eye exam is current.  Hyperlipidemia This is a chronic problem. The current episode started more than 1 year ago. The problem is uncontrolled. Recent lipid tests were reviewed and are high. Exacerbating diseases include chronic renal disease, diabetes and obesity. Factors aggravating her hyperlipidemia include beta  blockers and fatty foods. Pertinent negatives include no chest pain, myalgias or shortness of breath. Current antihyperlipidemic treatment includes statins. The current treatment provides mild improvement of lipids. Compliance problems include adherence to diet and adherence to exercise.  Risk factors for coronary artery disease include diabetes mellitus, dyslipidemia, family history, hypertension, obesity and a sedentary lifestyle.  Hypertension This is a chronic problem. The current episode started more than 1 year ago. The problem has been gradually improving since onset. The problem is controlled. Pertinent negatives include no blurred vision, chest pain, headaches, palpitations or shortness of breath. There are no associated agents to hypertension. Risk factors for coronary artery disease include diabetes mellitus, dyslipidemia, obesity, sedentary lifestyle and family history. Past treatments include beta blockers and diuretics. The current treatment provides significant improvement. There are no compliance problems.  Hypertensive end-organ damage includes kidney disease and heart failure. Identifiable causes of hypertension include chronic renal disease.    Review of systems  Constitutional: + Minimally fluctuating body weight,  current Body mass index is 44.08 kg/m. , no fatigue, no subjective hyperthermia, no subjective hypothermia Eyes: no blurry vision, no xerophthalmia ENT: no sore throat,  no nodules palpated in throat, no dysphagia/odynophagia, no hoarseness Cardiovascular: no chest pain, no shortness of breath, no palpitations, no leg swelling Respiratory: no cough, no shortness of breath Gastrointestinal: no nausea/vomiting/diarrhea Musculoskeletal: no muscle/joint aches Skin: no rashes, no hyperemia Neurological: no tremors, no numbness, no tingling, no dizziness Psychiatric: no depression, no anxiety  Objective:    BP (!) 148/87 (BP Location: Right Arm)   Pulse 63   Ht 5\' 4"   (1.626 m)   Wt 256 lb 12.8 oz (116.5 kg)   BMI 44.08 kg/m   Wt Readings from Last 3 Encounters:  03/18/21 256 lb 12.8 oz (116.5 kg)  12/18/20 254 lb (115.2 kg)  12/11/20 252 lb (114.3 kg)    BP Readings from Last 3 Encounters:  03/18/21 (!) 148/87  12/18/20 101/69  12/11/20 134/78     Physical Exam- Limited  Constitutional:  Body mass index is 44.08 kg/m. , not in acute distress, normal state of mind Eyes:  EOMI, no exophthalmos Neck: Supple Cardiovascular: RRR, no murmers, rubs, or gallops, no edema Respiratory: Adequate breathing efforts, no crackles, rales, rhonchi, or wheezing Musculoskeletal: no gross deformities, strength intact in all four extremities, no gross restriction of joint movements Skin:  no rashes, no hyperemia Neurological: no tremor with outstretched hands  Foot exam:   No rashes, ulcers, cuts, calluses, onychodystrophy.   Good pulses bilat.  Good sensation to 10 g monofilament bilat.  Ingrown toenails to bilateral great toes   POCT ABI Results 03/18/21   Right ABI:  1.18      Left ABI:  1.14  Right leg systolic / diastolic: 174/85 mmHg Left leg systolic / diastolic: 169/85 mmHg  Arm systolic / diastolic: 148/87 mmHG  Detailed report will be scanned into patient chart.    CMP ( most recent) CMP     Component Value Date/Time   NA 142 02/11/2021 1226   K 4.3 02/11/2021 1226   CL 103 02/11/2021 1226   CO2 22 02/11/2021 1226   GLUCOSE 77 02/11/2021 1226   GLUCOSE 218 (H) 02/27/2020 0458   BUN 10 02/11/2021 1226   CREATININE 1.09 (H) 02/11/2021 1226   CALCIUM 9.5 02/11/2021 1226   GFRNONAA 97 12/11/2020 1005   GFRAA 111 12/11/2020 1005     Diabetic Labs (most recent): Lab Results  Component Value Date   HGBA1C 6.7 03/18/2021   HGBA1C 8.8 (A) 12/18/2020   HGBA1C 10.1 (A) 09/03/2020     Lipid Panel ( most recent) Lipid Panel     Component Value Date/Time   CHOL 194 12/04/2020 0933   TRIG 88 12/04/2020 0933   HDL 55  12/04/2020 0933   CHOLHDL 3.5 12/04/2020 0933   LDLCALC 123 (H) 12/04/2020 0933   LABVLDL 16 12/04/2020 0933      Lab Results  Component Value Date   TSH 0.613 12/04/2020   TSH 1.790 04/19/2020   FREET4 1.17 12/04/2020      Assessment & Plan:   1) DM (diabetes mellitus) type 2, uncontrolled, with ketoacidosis (HCC)  - Dominque Levandowski has currently uncontrolled symptomatic type 2 DM since  45 years of age.   She presents today with her meter and logs showing improved fasting glycemic profile, near target.  Her POCT A1c today is 6.7%, improving from previous visit of 8.8%.  She is continuing to take her 31 under the guidance of her cardiologist.  There are no episodes of hypoglycemia noted.  She reports she may be moving to [place] in the coming months  to pursue her career.  Recent labs reviewed.  - I had a long discussion with her about the progressive nature of diabetes and the pathology behind its complications. -her diabetes is complicated by obesity/sedentary life and she remains at a high risk for more acute and chronic complications which include CAD, CVA, CKD, retinopathy, and neuropathy. These are all discussed in detail with her.  - Nutritional counseling repeated at each appointment due to patients tendency to fall back in to old habits.  - The patient admits there is a room for improvement in their diet and drink choices. -  Suggestion is made for the patient to avoid simple carbohydrates from their diet including Cakes, Sweet Desserts / Pastries, Ice Cream, Soda (diet and regular), Sweet Tea, Candies, Chips, Cookies, Sweet Pastries,  Store Bought Juices, Alcohol in Excess of  1-2 drinks a day, Artificial Sweeteners, Coffee Creamer, and "Sugar-free" Products. This will help patient to have stable blood glucose profile and potentially avoid unintended weight gain.   - I encouraged the patient to switch to  unprocessed or minimally processed complex starch and increased  protein intake (animal or plant source), fruits, and vegetables.   - Patient is advised to stick to a routine mealtimes to eat 3 meals  a day and avoid unnecessary snacks ( to snack only to correct hypoglycemia).  - she will be scheduled with Norm SaltPenny Crumpton, RDN, CDE for diabetes education.  - I have approached her with the following individualized plan to manage  her diabetes and patient agrees:   -In light of her presentation with significant glycemic improvement she will not need prandial insulin for now.    -Her glucose continues to improve over time with diet and recent medication changes by us and cardiologist.  She is advised to continue Lantus 60 units nightly along with her Metformin 500 mg ER daily with breakfast and Farxiga 10 mg po daily (prescribed by her cardiologist).   -She is encouraged to monitor blood glucose twice daily, before breakfast and before bed, and to call the clinic if she has readings less than 70 or greater than 300 for 3 tests in a row.  - Specific targets for  A1c;  LDL, HDL,  and Triglycerides were discussed with the patient.  2) Blood Pressure /Hypertension:   Her blood pressure is not controlled to target.   she is advised to continue her current medications including Metoprolol 200 mg XL po daily, Entresto 24-26 mg p.o. twice daily, Lasix 40 mg po every other day, and Spironolactone 12.5 mg po daily as recommended by her cardiologist.  3) Lipids/Hyperlipidemia:   Her most recent lipid panel from 12/04/20 shows uncontrolled LDL at 123.  She is advised to continue Lipitor 40 mg po daily at bedtime along with working on her diet and exercise.  Side effects and precautions discussed with her.  4)  Weight/Diet: Her Body mass index is 44.08 kg/m.  -   clearly complicating her diabetes care.   she is a candidate for weight loss. I discussed with her the fact that loss of 5 - 10% of her  current body weight will have the most impact on her diabetes management.   Exercise, and detailed carbohydrates information provided  -  detailed on discharge instructions.  5) Chronic Care/Health Maintenance: -she is on ACEI/ARB and Statin medications and is encouraged to initiate and continue to follow up with Ophthalmology, Dentist, Podiatrist at least yearly or according to recommendations, and advised to stay away from  smoking. I have recommended yearly flu vaccine and pneumonia vaccine at least every 5 years; moderate intensity exercise for up to 150 minutes weekly; and  sleep for at least 7 hours a day.    - she is advised to maintain close follow up with [name], MD for primary care needs, as well as her other providers for optimal and coordinated care.     I spent 46 minutes in the care of the patient today including review of labs from CMP, Lipids, Thyroid Function, Hematology (current and previous including abstractions from other facilities); face-to-face time discussing  her blood glucose readings/logs, discussing hypoglycemia and hyperglycemia episodes and symptoms, medications doses, her options of short and long term treatment based on the latest standards of care / guidelines;  discussion about incorporating lifestyle medicine;  and documenting the encounter.    Please refer to Patient Instructions for Blood Glucose Monitoring and Insulin/Medications Dosing Guide"  in media tab for additional information. Please  also refer to " Patient Self Inventory" in the Media  tab for reviewed elements of pertinent patient history.  Julianne Rice participated in the discussions, expressed understanding, and voiced agreement with the above plans.  All questions were answered to her satisfaction. she is encouraged to contact clinic should she have any questions or concerns prior to her return visit.   Follow up plan: - Return in about 4 months (around 07/18/2021) for Diabetes follow up with A1c in office, Previsit labs, Bring glucometer and logs.  Ronny Bacon, [facility] [address] [place], [place] [address] Phone: [phone] Fax: [phone]  03/18/2021, 2:39 PM

## 2021-03-18 NOTE — Patient Instructions (Signed)
Advice for Weight Management  -For most of us the best way to lose weight is by diet management. Generally speaking, diet management means consuming less calories intentionally which over time brings about progressive weight loss.  This can be achieved more effectively by restricting carbohydrate consumption to the minimum possible.  So, it is critically important to know your numbers: how much calorie you are consuming and how much calorie you need. More importantly, our carbohydrates sources should be unprocessed or minimally processed complex starch food items.   Sometimes, it is important to balance nutrition by increasing protein intake (animal or plant source), fruits, and vegetables.  -Sticking to a routine mealtime to eat 3 meals a day and avoiding unnecessary snacks is shown to have a big role in weight control. Under normal circumstances, the only time we lose real weight is when we are hungry, so allow hunger to take place- hunger means no food between meal times, only water.  It is not advisable to starve.   -It is better to avoid simple carbohydrates including: Cakes, Sweet Desserts, Ice Cream, Soda (diet and regular), Sweet Tea, Candies, Chips, Cookies, Store Bought Juices, Alcohol in Excess of  1-2 drinks a day, Artificial Sweeteners, Doughnuts, Coffee Creamers, "Sugar-free" Products, etc, etc.  This is not a complete list.....    -Consulting with certified diabetes educators is proven to provide you with the most accurate and current information on diet.  Also, you may be  interested in discussing diet options/exchanges , we can schedule a visit with Connie Luna, RDN, CDE for individualized nutrition education.  -Exercise: If you are able: 30 -60 minutes a day ,4 days a week, or 150 minutes a week.  The longer the better.  Combine stretch, strength, and aerobic activities.  If you were told in the past that you have high risk for cardiovascular diseases, you may seek evaluation by  your heart doctor prior to initiating moderate to intense exercise programs.    

## 2021-03-26 NOTE — Progress Notes (Deleted)
Cardiology Office Note:    Date:  03/26/2021   ID:  Connie Luna, DOB 07-20-1976, MRN [id number]  PCP:  [name], MD  Cardiologist:  [name], MD  Electrophysiologist:  None   Referring MD: [name], MD   No chief complaint on file.   History of Present Illness:    Connie Luna is a 45 y.o. female with a hx of hypertension, hyperlipidemia, diabetes who presents for follow-up.  She was initially seen as a ED follow-up for chest pain on 02/28/2020.  Recent Covid infection in January.  Reports she woke up with chest pain around 3 AM on 3/30.  Described as sharp stabbing pain on left side of chest.  Lasted a few seconds and resolved, would occur repeatedly every few minutes for about 30 minutes.  She also describes a sensation that something is sitting in her chest, describes as feeling like food being stuck, it has been continuous for 1 week.  Denies any shortness of breath.  She typically walks for 20 minutes 2-3 times per week, denies any exertional chest pain or dyspnea.  She has been on losartan for hypertension, but was only taking about once per week.  No smoking history.  No history of heart disease in her immediate family.  Work-up in the ED included D-dimer negative, troponin 24 -> 22.  Lexiscan Myoview on 03/19/2020 showed small fixed defect at the apex, no evidence of ischemia (though no poor quality study with low cardiac counts compared to extracardiac activity), EF 31%.  TTE on 04/02/2020 showed basal inferior/inferoseptal akinesis with LVEF 35 to 40%, grade 2 diastolic dysfunction, moderate LV dilatation, no significant valvular disease, IVC small/collapsible.  RHC/LHC on 04/24/2020 showed normal coronary arteries, RA 7, RV 46/7, PA 36/15/26, PWCP 22, LVEDP 32, CI 4.5.  She was started on Lasix 40 mg daily and spironolactone 12.5 mg daily.  Cardiac MRI on 08/13/2020 showed LVEF 37%, normal RV function, mid wall LGE.  Echocardiogram 03/11/2021 showed LVEF 35 to  40%.  Since last clinic visit,   she reports that she has been doing well.  Reports occasional chest pain, about every 2 weeks, that she describes as right-sided pain lasting for few seconds and occurring at rest.  She denies any exertional symptoms, though has not been exercising.  Denies any shortness of breath, lightheadedness, syncope, or lower extremity edema.  She has been taking her Lasix every other day.  Reports weight has been stable.  Denies any palpitations.  Reports BP has been 110s over 60s at home.   Wt Readings from Last 3 Encounters:  03/18/21 256 lb 12.8 oz (116.5 kg)  12/18/20 254 lb (115.2 kg)  12/11/20 252 lb (114.3 kg)    BP Readings from Last 3 Encounters:  03/18/21 (!) 148/87  12/18/20 101/69  12/11/20 134/78     Past Medical History:  Diagnosis Date  . Diabetes mellitus without complication (HCC)   . Hypertension     Past Surgical History:  Procedure Laterality Date  . RIGHT/LEFT HEART CATH AND CORONARY ANGIOGRAPHY N/A 04/24/2020   Procedure: RIGHT/LEFT HEART CATH AND CORONARY ANGIOGRAPHY;  Surgeon: [name], MD;  Location: [facility] INVASIVE CV LAB;  Service: Cardiovascular;  Laterality: N/A;    Current Medications: No outpatient medications have been marked as taking for the 03/27/21 encounter (Appointment) with [name], MD.   Current Facility-Administered Medications for the 03/27/21 encounter (Appointment) with [name], MD  Medication  . sodium chloride flush (NS) 0.9 %  injection 3 mL     Allergies:   Patient has no known allergies.   Social History   Socioeconomic History  . Marital status: Married    Spouse name: Not on file  . Number of children: Not on file  . Years of education: Not on file  . Highest education level: Not on file  Occupational History  . Not on file  Tobacco Use  . Smoking status: Never Smoker  . Smokeless tobacco: Never Used  Vaping Use  . Vaping Use: Never used  Substance and  Sexual Activity  . Alcohol use: Never  . Drug use: Never  . Sexual activity: Never  Other Topics Concern  . Not on file  Social History Narrative  . Not on file   Social Determinants of Health   Financial Resource Strain: Not on file  Food Insecurity: Not on file  Transportation Needs: Not on file  Physical Activity: Not on file  Stress: Not on file  Social Connections: Not on file     Family History: The patient's family history includes Diabetes in her father, paternal aunt, paternal grandfather, paternal grandmother, and paternal uncle; Hyperlipidemia in her mother; Hypertension in her mother. There is no history of Breast cancer.  ROS:   Please see the history of present illness.     All other systems reviewed and are negative.  EKGs/Labs/Other Studies Reviewed:    The following studies were reviewed today:   EKG:  EKG is not ordered today.  The ekg ordered at prior clinic visit normal sinus rhythm, rate 75, poor R wave progression, nonspecific T wave flattening  Recent Labs: 04/19/2020: Platelets 245 04/24/2020: Hemoglobin 10.5 12/04/2020: TSH 0.613 12/11/2020: Magnesium 1.6 02/11/2021: BUN 10; Creatinine, Ser 1.09; Potassium 4.3; Sodium 142  Recent Lipid Panel    Component Value Date/Time   CHOL 194 12/04/2020 0933   TRIG 88 12/04/2020 0933   HDL 55 12/04/2020 0933   CHOLHDL 3.5 12/04/2020 0933   LDLCALC 123 (H) 12/04/2020 0933    Physical Exam:    VS:  There were no vitals taken for this visit.    Wt Readings from Last 3 Encounters:  03/18/21 256 lb 12.8 oz (116.5 kg)  12/18/20 254 lb (115.2 kg)  12/11/20 252 lb (114.3 kg)     GEN:  in no acute distress HEENT: Normal NECK: No JVD LYMPHATICS: No lymphadenopathy CARDIAC:RRR, no murmurs, rubs, gallops RESPIRATORY:  Clear to auscultation without rales, wheezing or rhonchi  ABDOMEN: Soft, non-tender, non-distended MUSCULOSKELETAL:  No edema; No deformity  SKIN: Warm and dry NEUROLOGIC:  Alert and  oriented x 3 PSYCHIATRIC:  Normal affect   ASSESSMENT:    No diagnosis found. PLAN:    Chronic combined systolic and diastolic heart failure:  Lexiscan Myoview on 03/19/2020 showed small fixed defect at the apex, no evidence of ischemia but was poor quality study with low cardiac counts compared to extracardiac activity.  EF 31%.  TTE on 04/02/2020 showed basal inferior/inferoseptal akinesis with LVEF 35 to 40%, grade 2 diastolic dysfunction, moderate LV dilatation, no significant valvular disease, IVC small/collapsible.  RHC/LHC on 04/24/2020 showed normal coronary arteries, RA 7, RV 46/7, PA 36/15/26, PWCP 22, LVEDP 32, CI 4.5.  Cardiac MRI on 08/13/2020 showed LVEF 37%, normal RV function, mid wall LGE.  Echocardiogram 03/11/2021 showed LVEF 35 to 40%. -Continue Entresto 24-26 mg twice daily -Continue toprol XL 200 mg daily -Continue spironolactone 12.5 mg daily -Continue Farxiga 10 mg daily -Continue as needed Lasix.  Appears  euvolemic.  Advised to monitor daily weights and take Lasix if gains more than 3 pounds in 1 day or 5 pounds in one week  Hypertension: On Entresto, Aldactone, Toprol-XL.  Appears well controlled  Type 2 diabetes: On Lantus and Farxiga.  A1c 10.1.  Follows with endocrinology.    Hyperlipidemia: Continue atorvastatin 40 mg daily.  LDL 123 on 12/04/2020  Anemia: Hemoglobin 10.4 on 04/19/2020.   Iron studies consistent with iron deficiency anemia, started on iron supplementation.  Reports heavy menstrual bleeding, otherwise denies any bleeding issues  RTC in***  Medication Adjustments/Labs and Tests Ordered: Current medicines are reviewed at length with the patient today.  Concerns regarding medicines are outlined above.  No orders of the defined types were placed in this encounter.  No orders of the defined types were placed in this encounter.   There are no Patient Instructions on file for this visit.   Signed, [name], MD  03/26/2021 11:19 PM     [facility]

## 2021-03-27 ENCOUNTER — Ambulatory Visit: Payer: Medicaid Other | Admitting: Cardiology

## 2021-04-13 ENCOUNTER — Other Ambulatory Visit: Payer: Self-pay | Admitting: Cardiology

## 2021-04-14 NOTE — Telephone Encounter (Signed)
This is Dr. Schumann's pt 

## 2021-04-23 ENCOUNTER — Encounter (INDEPENDENT_AMBULATORY_CARE_PROVIDER_SITE_OTHER): Payer: Self-pay

## 2021-04-23 ENCOUNTER — Encounter (INDEPENDENT_AMBULATORY_CARE_PROVIDER_SITE_OTHER): Payer: Medicaid Other | Admitting: Ophthalmology

## 2021-04-23 ENCOUNTER — Other Ambulatory Visit: Payer: Self-pay

## 2021-05-05 NOTE — Progress Notes (Signed)
Cardiology Office Note:    Date:  05/06/2021   ID:  Connie Luna, DOB 02/25/1976, MRN [id number]  PCP:  Leilani Ableeese, Betti, MD  Cardiologist:  Little Ishikawahristopher L Marinell Igarashi, MD  Electrophysiologist:  None   Referring MD: Leilani Ableeese, Betti, MD   Chief Complaint  Patient presents with  . Congestive Heart Failure    History of Present Illness:    Connie Luna is a 45 y.o. female with a hx of hypertension, hyperlipidemia, diabetes who presents for follow-up.  She was initially seen as a ED follow-up for chest pain on 02/28/2020.  Recent Covid infection in January.  Reports she woke up with chest pain around 3 AM on 3/30.  Described as sharp stabbing pain on left side of chest.  Lasted a few seconds and resolved, would occur repeatedly every few minutes for about 30 minutes.  She also describes a sensation that something is sitting in her chest, describes as feeling like food being stuck, it has been continuous for 1 week.  Denies any shortness of breath.  She typically walks for 20 minutes 2-3 times per week, denies any exertional chest pain or dyspnea.  She has been on losartan for hypertension, but was only taking about once per week.  No smoking history.  No history of heart disease in her immediate family.  Work-up in the ED included D-dimer negative, troponin 24 -> 22.  Lexiscan Myoview on 03/19/2020 showed small fixed defect at the apex, no evidence of ischemia (though no poor quality study with low cardiac counts compared to extracardiac activity), EF 31%.  TTE on 04/02/2020 showed basal inferior/inferoseptal akinesis with LVEF 35 to 40%, grade 2 diastolic dysfunction, moderate LV dilatation, no significant valvular disease, IVC small/collapsible.  RHC/LHC on 04/24/2020 showed normal coronary arteries, RA 7, RV 46/7, PA 36/15/26, PWCP 22, LVEDP 32, CI 4.5.  She was started on Lasix 40 mg daily and spironolactone 12.5 mg daily.  Cardiac MRI on 08/13/2020 showed LVEF 37%, normal RV function, mid wall LGE.  Echo  on 03/11/2021 showed LVEF stable at 35 to 40%.  Today, her husband joins her by phone. Since last clinic visit, she has been feeling good overall. Occasionally, if she sits for a long time while working at home, she becomes lightheaded when she stands up. She has not needed to take Lasix. Her hemoglobin A1C is 6.7. Also, she endorses snoring and was tested for sleep apnea 10+ years ago which was negative. For exercise, she usually walks but is inconsistent. Lately she is walking 2-3 days a week and plans to increase this. She denies any chest pain, shortness of breath, palpitations, or exertional symptoms. No headaches, or syncope to report. Also has no lower extremity edema, orthopnea or PND.    Wt Readings from Last 3 Encounters:  05/06/21 257 lb (116.6 kg)  03/18/21 256 lb 12.8 oz (116.5 kg)  12/18/20 254 lb (115.2 kg)    BP Readings from Last 3 Encounters:  05/06/21 126/78  03/18/21 (!) 148/87  12/18/20 101/69     Past Medical History:  Diagnosis Date  . Diabetes mellitus without complication (HCC)   . Hypertension     Past Surgical History:  Procedure Laterality Date  . RIGHT/LEFT HEART CATH AND CORONARY ANGIOGRAPHY N/A 04/24/2020   Procedure: RIGHT/LEFT HEART CATH AND CORONARY ANGIOGRAPHY;  Surgeon: Marykay LexHarding, David W, MD;  [facility];  Service: Cardiovascular;  Laterality: N/A;    Current Medications: Current Meds  Medication Sig  . ACCU-CHEK GUIDE test  strip USED AS DIRECTED TO CHECK BLOOD SUGARS 3 TIMES A DAY.  Marland Kitchen APPLE CIDER VINEGAR PO Take 2 tablets by mouth in the morning and at bedtime. [place]  . aspirin EC 81 MG tablet Take 81 mg by mouth at bedtime.  Marland Kitchen atorvastatin (LIPITOR) 40 MG tablet Take 40 mg by mouth at bedtime.  Marland Kitchen ENTRESTO 24-26 MG TAKE 1 TABLET BY MOUTH 2 (TWO) TIMES DAILY.  Marland Kitchen FARXIGA 10 MG TABS tablet TAKE 1 TABLET BY MOUTH DAILY BEFORE BREAKFAST.  . fexofenadine (ALLEGRA) 180 MG tablet Take 180 mg by mouth daily.  . fluticasone (FLONASE)  50 MCG/ACT nasal spray Place 2 sprays into both nostrils in the morning and at bedtime.  . furosemide (LASIX) 40 MG tablet Take 40 mg by mouth daily as needed.  . insulin glargine (LANTUS) 100 UNIT/ML Solostar Pen Inject 60 Units into the skin at bedtime.  . metFORMIN (GLUCOPHAGE-XR) 500 MG 24 hr tablet TAKE 1 TABLET BY MOUTH EVERY DAY WITH BREAKFAST  . metoprolol (TOPROL-XL) 200 MG 24 hr tablet TAKE 1 TABLET BY MOUTH EVERY DAY  . omeprazole (PRILOSEC) 20 MG capsule Take 1 capsule (20 mg total) by mouth daily. (Patient taking differently: Take 20 mg by mouth daily as needed (Reflux).)  . spironolactone (ALDACTONE) 25 MG tablet TAKE 1/2 TABLET BY MOUTH EVERY DAY  . tetrahydrozoline-zinc (VISINE-AC) 0.05-0.25 % ophthalmic solution Place 2 drops into both eyes 2 (two) times daily as needed (Dry Eyes).   Current Facility-Administered Medications for the 05/06/21 encounter (Office Visit) with [name], MD  Medication  . sodium chloride flush (NS) 0.9 % injection 3 mL     Allergies:   Patient has no known allergies.   Social History   Socioeconomic History  . Marital status: Married    Spouse name: Not on file  . Number of children: Not on file  . Years of education: Not on file  . Highest education level: Not on file  Occupational History  . Not on file  Tobacco Use  . Smoking status: Never Smoker  . Smokeless tobacco: Never Used  Vaping Use  . Vaping Use: Never used  Substance and Sexual Activity  . Alcohol use: Never  . Drug use: Never  . Sexual activity: Never  Other Topics Concern  . Not on file  Social History Narrative  . Not on file   Social Determinants of Health   Financial Resource Strain: Not on file  Food Insecurity: Not on file  Transportation Needs: Not on file  Physical Activity: Not on file  Stress: Not on file  Social Connections: Not on file     Family History: The patient's family history includes Diabetes in her father, paternal aunt,  paternal grandfather, paternal grandmother, and paternal uncle; Hyperlipidemia in her mother; Hypertension in her mother. There is no history of Breast cancer.  ROS:   Please see the history of present illness.    (+) Lightheadedness All other systems reviewed and are negative.  EKGs/Labs/Other Studies Reviewed:    The following studies were reviewed today:  Echo 03/11/2021: 1. Left ventricular ejection fraction, by estimation, is 35 to 40%. The  left ventricle has moderately decreased function. The left ventricle  demonstrates global hypokinesis. There is mild left ventricular  hypertrophy. Left ventricular diastolic  parameters are consistent with Grade I diastolic dysfunction (impaired  relaxation).  2. Right ventricular systolic function is normal. The right ventricular  size is normal.  3. The mitral valve is grossly normal.  Trivial mitral valve  regurgitation.  4. The aortic valve is tricuspid. Aortic valve regurgitation is not  visualized.  5. The inferior vena cava is normal in size with greater than 50%  respiratory variability, suggesting right atrial pressure of 3 mmHg.   Comparison(s): No significant change from prior study. 04/02/2020: LVEF  35-40%.   MR Cardiac Morphology 08/13/2020: IMPRESSION: 1. Normal left ventricular size by indexed volume. Moderately reduced systolic function. LVEF 37%.  2.  Normal right ventricular chamber size and systolic function.  3. Nonspecific delayed myocardial enhancement. Midmyocardial stripe of delay enhancement circumferentially. ECV 36%. No myocardial edema.  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  LV end diastolic pressure is moderate-severely elevated.  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 daily Lasix as well as 12.5 mg spironolactone.  Increase Toprol to 50 mg daily  Anticipate converting from losartan to Entresto.  Follow-up with Dr. Bjorn Pippin   Echo 04/02/2020: 1. Akinesis of the basal inferior and inferoseptal walls; overall  moderate LV dysfunction; grade 2 diastolic dysfunction; moderate LVE; mild  LAE.  2. Left ventricular ejection fraction, by estimation, is 35 to 40%. The  left ventricle has moderately decreased function. The left ventricle  demonstrates regional wall motion abnormalities (see scoring  diagram/findings for description). The left  ventricular internal cavity size was moderately dilated. Left ventricular  diastolic parameters are consistent with Grade II diastolic dysfunction  (pseudonormalization).  3. Right ventricular systolic function is normal. The right ventricular  size is normal.  4. Left atrial size was mildly dilated.  5. The mitral valve is normal in structure. Trivial mitral valve  regurgitation. No evidence of mitral stenosis.  6. The aortic valve is tricuspid. Aortic valve regurgitation is not  visualized. No aortic stenosis is present.  7. The inferior vena cava is normal in size with greater than 50%  respiratory variability, suggesting right atrial pressure of 3 mmHg.   Lexiscan Myoview 03/19/2020:  Nuclear stress EF: 31%.  There was no ST segment deviation noted during stress.  This is a high risk study.  The left ventricular ejection fraction is moderately decreased (30-44%).  No prior study for comparison.   Difficult images, with low cardiac counts visually compared to extracardiac activity. There is  a small, mild, fixed defect at the apex, consider scar vs. Apical thinning. No evidence of ischemia. EF calculated as low with global hypokinesis, but recommend echocardiogram to confirm given limitations of this study.  EKG:   05/06/2021: NSR, rate 69, poor R wave progression, nonspecific T wave flattening 12/11/2020: EKG was not ordered.   09/18/2020: NSR, rate 75, poor R wave progression, nonspecific T wave flattening 05/06/2020: NSR, rate 95, no ST abnormalities  Recent Labs: 12/04/2020: TSH 0.613 12/11/2020: Magnesium 1.6 02/11/2021: BUN 10; Creatinine, Ser 1.09; Potassium 4.3; Sodium 142  Recent Lipid Panel    Component Value Date/Time   CHOL 194 12/04/2020 0933   TRIG 88 12/04/2020 0933   HDL 55 12/04/2020 0933   CHOLHDL 3.5 12/04/2020 0933   LDLCALC 123 (H) 12/04/2020 0933    Physical Exam:    VS:  BP 126/78  Pulse 69   Ht  (1.6 m)   Wt 257 lb (116.6 kg)   SpO2 97%   BMI 45.53 kg/m     Wt Readings from Last 3 Encounters:  05/06/21 257 lb (116.6 kg)  03/18/21 256 lb 12.8 oz (116.5 kg)  12/18/20 254 lb (115.2 kg)     GEN:  in no acute distress HEENT: Normal NECK: No JVD LYMPHATICS: No lymphadenopathy CARDIAC:RRR, no murmurs, rubs, gallops RESPIRATORY:  Clear to auscultation without rales, wheezing or rhonchi  ABDOMEN: Soft, non-tender, non-distended MUSCULOSKELETAL:  No edema; No deformity  SKIN: Warm and dry NEUROLOGIC:  Alert and oriented x 3 PSYCHIATRIC:  Normal affect   ASSESSMENT:    1. Chronic combined systolic (congestive) and diastolic (congestive) heart failure (HCC)   2. Essential hypertension   3. Hyperlipidemia, unspecified hyperlipidemia type   4. Class 3 severe obesity with body mass index (BMI) of 45.0 to 49.9 in adult, unspecified obesity type, unspecified whether serious comorbidity present (HCC)   5. Snoring    PLAN:    Chronic combined systolic and diastolic heart failure:  Lexiscan Myoview on 03/19/2020 showed small fixed defect at the  apex, no evidence of ischemia but was poor quality study with low cardiac counts compared to extracardiac activity.  EF 31%.  TTE on 04/02/2020 showed basal inferior/inferoseptal akinesis with LVEF 35 to 40%, grade 2 diastolic dysfunction, moderate LV dilatation, no significant valvular disease, IVC small/collapsible.  RHC/LHC on 04/24/2020 showed normal coronary arteries, RA 7, RV 46/7, PA 36/15/26, PWCP 22, LVEDP 32, CI 4.5.  Cardiac MRI on 08/13/2020 showed LVEF 37%, normal RV function, mid wall LGE.  Echo on 03/11/2021 showed LVEF stable at 35 to 40%. -Continue Entresto 24-26 mg twice daily.  Will check BMP.  If stable creatinine/potassium, plan to increase Entresto to 49-51 mg twice daily -Continue toprol XL 200 mg daily -Continue spironolactone 12.5 mg daily -Continue Farxiga 10 mg daily -Continue as needed Lasix.  Appears euvolemic.  Advised to monitor daily weights and take Lasix if gains more than 3 pounds in 1 day or 5 pounds in one week  Hypertension: On Entresto, Aldactone, Toprol-XL.  Appears well controlled  Type 2 diabetes: On Lantus and Farxiga.  Follows with endocrinology, marked improvement in hemoglobin A1c from 10.1 to 6.7% on 03/18/2021  Hyperlipidemia: Continue atorvastatin 40 mg daily.  LDL 123 on 12/04/2020  Anemia: Hemoglobin 10.4 on 04/19/2020.   Iron studies consistent with iron deficiency anemia, started on iron supplementation.  Reports heavy menstrual bleeding, otherwise denies any bleeding issues  Snoring: Check sleep study  Obesity: Body mass index is Body mass index is 45.53 kg/m.  Refer to healthy weight and wellness  RTC in 3 months  Medication Adjustments/Labs and Tests Ordered: Current medicines are reviewed at length with the patient today.  Concerns regarding medicines are outlined above.  Orders Placed This Encounter  Procedures  . Basic metabolic panel  . Ambulatory referral to [facility]  . EKG 12-Lead  . Split night study   No orders of the  defined types were placed in this encounter.   Patient Instructions  Medication Instructions:  Your physician recommends that you continue on your current medications as directed. Please refer to the Current Medication list given to you today.  *If you need a refill on your cardiac medications before your next appointment, please call your pharmacy*   Lab Work: BMET today  If you have labs (blood work) drawn today and your tests are completely  normal, you will receive your results only by: Marland Kitchen MyChart Message (if you have MyChart) OR . A paper copy in the mail If you have any lab test that is abnormal or we need to change your treatment, we will call you to review the results.   Testing/Procedures: Your physician has recommended that you have a sleep study. This test records several body functions during sleep, including: brain activity, eye movement, oxygen and carbon dioxide blood levels, heart rate and rhythm, breathing rate and rhythm, the flow of air through your mouth and nose, snoring, body muscle movements, and chest and belly movement.  Follow-Up: At [facility], you and your health needs are our priority.  As part of our continuing mission to provide you with exceptional heart care, we have created designated Provider Care Teams.  These Care Teams include your primary Cardiologist (physician) and Advanced Practice Providers (APPs -  Physician Assistants and Nurse Practitioners) who all work together to provide you with the care you need, when you need it.  We recommend signing up for the patient portal called "MyChart".  Sign up information is provided on this After Visit Summary.  MyChart is used to connect with patients for Virtual Visits (Telemedicine).  Patients are able to view lab/test results, encounter notes, upcoming appointments, etc.  Non-urgent messages can be sent to your provider as well.   To learn more about what you can do with MyChart, go to  [email or web address].    Your next appointment:   3 month(s)  The format for your next appointment:   In Person  Provider:   Epifanio Lesches, MD   Other Instructions You have been referred to [facility]      I,Mathew Stumpf,acting as a scribe for [name], MD.,have documented all relevant documentation on the behalf of [name], MD,as directed by  [name], MD while in the presence of [name], MD.  I, [name], MD, have reviewed all documentation for this visit. The documentation on 05/06/21 for the exam, diagnosis, procedures, and orders are all accurate and complete.   Signed, [name], MD  05/06/2021 10:00 AM    [facility]

## 2021-05-06 ENCOUNTER — Other Ambulatory Visit: Payer: Self-pay

## 2021-05-06 ENCOUNTER — Ambulatory Visit (INDEPENDENT_AMBULATORY_CARE_PROVIDER_SITE_OTHER): Payer: Medicaid Other | Admitting: Cardiology

## 2021-05-06 ENCOUNTER — Encounter: Payer: Self-pay | Admitting: Cardiology

## 2021-05-06 VITALS — BP 126/78 | HR 69 | Ht 63.0 in | Wt 257.0 lb

## 2021-05-06 DIAGNOSIS — E785 Hyperlipidemia, unspecified: Secondary | ICD-10-CM | POA: Diagnosis not present

## 2021-05-06 DIAGNOSIS — I5042 Chronic combined systolic (congestive) and diastolic (congestive) heart failure: Secondary | ICD-10-CM

## 2021-05-06 DIAGNOSIS — Z6841 Body Mass Index (BMI) 40.0 and over, adult: Secondary | ICD-10-CM

## 2021-05-06 DIAGNOSIS — R0683 Snoring: Secondary | ICD-10-CM

## 2021-05-06 DIAGNOSIS — I10 Essential (primary) hypertension: Secondary | ICD-10-CM

## 2021-05-06 LAB — BASIC METABOLIC PANEL
BUN/Creatinine Ratio: 10 (ref 9–23)
BUN: 7 mg/dL (ref 6–24)
CO2: 23 mmol/L (ref 20–29)
Calcium: 9 mg/dL (ref 8.7–10.2)
Chloride: 104 mmol/L (ref 96–106)
Creatinine, Ser: 0.7 mg/dL (ref 0.57–1.00)
Glucose: 125 mg/dL — ABNORMAL HIGH (ref 65–99)
Potassium: 4.8 mmol/L (ref 3.5–5.2)
Sodium: 141 mmol/L (ref 134–144)
eGFR: 109 mL/min/{1.73_m2} (ref >59)

## 2021-05-06 NOTE — Patient Instructions (Signed)
Medication Instructions:  Your physician recommends that you continue on your current medications as directed. Please refer to the Current Medication list given to you today.  *If you need a refill on your cardiac medications before your next appointment, please call your pharmacy*   Lab Work: BMET today  If you have labs (blood work) drawn today and your tests are completely normal, you will receive your results only by: Marland Kitchen MyChart Message (if you have MyChart) OR . A paper copy in the mail If you have any lab test that is abnormal or we need to change your treatment, we will call you to review the results.   Testing/Procedures: Your physician has recommended that you have a sleep study. This test records several body functions during sleep, including: brain activity, eye movement, oxygen and carbon dioxide blood levels, heart rate and rhythm, breathing rate and rhythm, the flow of air through your mouth and nose, snoring, body muscle movements, and chest and belly movement.  Follow-Up: At [facility], you and your health needs are our priority.  As part of our continuing mission to provide you with exceptional heart care, we have created designated Provider Care Teams.  These Care Teams include your primary Cardiologist (physician) and Advanced Practice Providers (APPs -  Physician Assistants and Nurse Practitioners) who all work together to provide you with the care you need, when you need it.  We recommend signing up for the patient portal called "MyChart".  Sign up information is provided on this After Visit Summary.  MyChart is used to connect with patients for Virtual Visits (Telemedicine).  Patients are able to view lab/test results, encounter notes, upcoming appointments, etc.  Non-urgent messages can be sent to your provider as well.   To learn more about what you can do with MyChart, go to [email or web address].    Your next appointment:   3 month(s)  The format for your  next appointment:   In Person  Provider:   Epifanio Lesches, MD   Other Instructions You have been referred to [facility]

## 2021-05-07 ENCOUNTER — Other Ambulatory Visit: Payer: Self-pay | Admitting: *Deleted

## 2021-05-07 DIAGNOSIS — Z79899 Other long term (current) drug therapy: Secondary | ICD-10-CM

## 2021-05-07 DIAGNOSIS — I5042 Chronic combined systolic (congestive) and diastolic (congestive) heart failure: Secondary | ICD-10-CM

## 2021-05-07 MED ORDER — ENTRESTO 49-51 MG PO TABS: 1.0000 | ORAL_TABLET | Freq: Two times a day (BID) | ORAL | 3 refills | Status: DC

## 2021-05-12 ENCOUNTER — Other Ambulatory Visit: Payer: Self-pay | Admitting: *Deleted

## 2021-05-12 DIAGNOSIS — I5042 Chronic combined systolic (congestive) and diastolic (congestive) heart failure: Secondary | ICD-10-CM

## 2021-05-12 DIAGNOSIS — Z79899 Other long term (current) drug therapy: Secondary | ICD-10-CM

## 2021-05-16 ENCOUNTER — Telehealth: Payer: Self-pay | Admitting: *Deleted

## 2021-05-16 NOTE — Telephone Encounter (Signed)
Patient notified of sleep study appointment. Per Shirlene with [employer] no PA is required.

## 2021-05-16 NOTE — Telephone Encounter (Signed)
-----   Message from Harvel Ricks, RN sent at 05/06/2021 10:08 AM EDT ----- Regarding: Split night Split night ordered per Dr. Bjorn Pippin  [place] in chart  Thanks!

## 2021-05-19 ENCOUNTER — Encounter: Payer: Self-pay | Admitting: *Deleted

## 2021-05-20 ENCOUNTER — Other Ambulatory Visit: Payer: Self-pay | Admitting: "Endocrinology

## 2021-05-21 ENCOUNTER — Telehealth: Payer: Self-pay

## 2021-05-21 NOTE — Telephone Encounter (Signed)
**Note De-Identified [place] Gustafson Obfuscation** I started a Entresto PA through covermymeds. Key: [id number]

## 2021-05-21 NOTE — Telephone Encounter (Signed)
**Note De-identified Josue Kass Obfuscation** -----  **Note De-Identified Reniyah Gootee Obfuscation** Message from Harvel Ricks, RN sent at 05/21/2021  8:39 AM EDT ----- Regarding: Sherryll Burger PA Received fax PA needed for [facility]: [id number]  Thank you!

## 2021-05-23 NOTE — Telephone Encounter (Signed)
Received fax from [employer]  PA for Entresto approved 05/21/21-05/21/22

## 2021-06-16 ENCOUNTER — Other Ambulatory Visit: Payer: Self-pay | Admitting: Cardiology

## 2021-06-16 ENCOUNTER — Other Ambulatory Visit: Payer: Self-pay | Admitting: Nurse Practitioner

## 2021-06-16 DIAGNOSIS — I5041 Acute combined systolic (congestive) and diastolic (congestive) heart failure: Secondary | ICD-10-CM

## 2021-06-18 ENCOUNTER — Other Ambulatory Visit: Payer: Self-pay | Admitting: Cardiology

## 2021-06-18 MED ORDER — ENTRESTO 49-51 MG PO TABS: 1.0000 | ORAL_TABLET | Freq: Two times a day (BID) | ORAL | 3 refills | Status: DC

## 2021-06-18 NOTE — Telephone Encounter (Signed)
This pt out of med for the past week but this isn't an anticoagulant so I will route to nl refills

## 2021-06-18 NOTE — Telephone Encounter (Signed)
*  STAT* If patient is at the pharmacy, call can be transferred to refill team.   1. Which medications need to be refilled? (please list name of each medication and dose if known)  sacubitril-valsartan (ENTRESTO) 49-51 MG  2. Which pharmacy/location (including street and city if local pharmacy) is medication to be sent to? [id number] - [place], [place] - [address]  3. Do they need a 30 day or 90 day supply? 90 day supply  Patient states she has been completely out of medication for 1 week.

## 2021-06-21 ENCOUNTER — Ambulatory Visit
Admission: EM | Admit: 2021-06-21 | Discharge: 2021-06-21 | Disposition: A | Discharge status: Home / Self Care | Payer: Medicaid Other | Attending: Physician Assistant | Admitting: Physician Assistant

## 2021-06-21 ENCOUNTER — Encounter: Payer: Self-pay | Admitting: Emergency Medicine

## 2021-06-21 ENCOUNTER — Other Ambulatory Visit: Payer: Self-pay

## 2021-06-21 DIAGNOSIS — H60391 Other infective otitis externa, right ear: Secondary | ICD-10-CM

## 2021-06-21 MED ORDER — NEOMYCIN-POLYMYXIN-HC 1 % OT SOLN: 3.0000 [drp] | Freq: Four times a day (QID) | OTIC | 0 refills | Status: AC

## 2021-06-21 NOTE — ED Triage Notes (Signed)
Pt here for right ear pain x 4 days

## 2021-06-21 NOTE — ED Provider Notes (Signed)
[facility]    CSN: [id number] Arrival date & time: 06/21/21  0836      History   Chief Complaint Chief Complaint  Patient presents with   Otalgia    HPI Connie Luna is a 45 y.o. female.   The history is provided by the patient. No language interpreter was used.  Otalgia Location:  Right Behind ear:  No abnormality Quality:  Aching Severity:  Mild Onset quality:  Gradual Timing:  Constant Progression:  Worsening Chronicity:  New Relieved by:  Nothing Worsened by:  Nothing Ineffective treatments:  None tried Risk factors: no chronic ear infection    Past Medical History:  Diagnosis Date   Diabetes mellitus without complication (HCC)    Hypertension     Patient Active Problem List   Diagnosis Date Noted   DM (diabetes mellitus) type 2, uncontrolled, with ketoacidosis (HCC) 09/03/2020   Mixed hyperlipidemia 09/03/2020   Essential hypertension, benign 09/03/2020   Dilated cardiomyopathy (HCC) 04/24/2020   Acute combined systolic and diastolic heart failure (HCC)     Past Surgical History:  Procedure Laterality Date   RIGHT/LEFT HEART CATH AND CORONARY ANGIOGRAPHY N/A 04/24/2020   Procedure: RIGHT/LEFT HEART CATH AND CORONARY ANGIOGRAPHY;  Surgeon: [name], MD;  Location: [facility] INVASIVE CV LAB;  Service: Cardiovascular;  Laterality: N/A;    OB History   No obstetric history on file.      Home Medications    Prior to Admission medications   Medication Sig Start Date End Date Taking? Authorizing Provider  NEOMYCIN-POLYMYXIN-HYDROCORTISONE (CORTISPORIN) 1 % SOLN OTIC solution Place 3 drops into the right ear every 6 (six) hours. 06/21/21  Yes Cheron Schaumann K, PA-C  ACCU-CHEK GUIDE test strip USED AS DIRECTED TO CHECK BLOOD SUGARS 3 TIMES A DAY. 02/16/21   [provider]  APPLE CIDER VINEGAR PO Take 2 tablets by mouth in the morning and at bedtime. [place]    [provider]  aspirin EC 81 MG tablet Take 81 mg by mouth at  bedtime.    [provider]  atorvastatin (LIPITOR) 40 MG tablet Take 40 mg by mouth at bedtime.    [provider]  FARXIGA 10 MG TABS tablet TAKE 1 TABLET BY MOUTH DAILY BEFORE BREAKFAST. 04/14/21   [name], MD  fexofenadine (ALLEGRA) 180 MG tablet Take 180 mg by mouth daily.    [provider]  fluticasone (FLONASE) 50 MCG/ACT nasal spray Place 2 sprays into both nostrils in the morning and at bedtime. 03/18/21   Dani Gobble, NP  furosemide (LASIX) 40 MG tablet Take 40 mg by mouth daily as needed.    [provider]  insulin glargine (LANTUS) 100 UNIT/ML Solostar Pen Inject 60 Units into the skin at bedtime.    [provider]  metFORMIN (GLUCOPHAGE-XR) 500 MG 24 hr tablet TAKE 1 TABLET BY MOUTH EVERY DAY WITH BREAKFAST 06/17/21   Dani Gobble, NP  metoprolol (TOPROL-XL) 200 MG 24 hr tablet TAKE 1 TABLET BY MOUTH EVERY DAY 12/16/20   [name], MD  omeprazole (PRILOSEC) 20 MG capsule Take 1 capsule (20 mg total) by mouth daily. Patient taking differently: Take 20 mg by mouth daily as needed (Reflux). 02/27/20   Gwyneth Sprout, MD  sacubitril-valsartan (ENTRESTO) 49-51 MG Take 1 tablet by mouth 2 (two) times daily. 06/18/21   [name], MD  spironolactone (ALDACTONE) 25 MG tablet TAKE 1/2 TABLET BY MOUTH EVERY DAY 07/16/20   [name],  MD  tetrahydrozoline-zinc (VISINE-AC) 0.05-0.25 % ophthalmic solution Place 2 drops into both eyes 2 (two) times daily as needed (Dry Eyes).    [provider]    Family History Family History  Problem Relation Age of Onset   Hypertension Mother    Hyperlipidemia Mother    Diabetes Father    Diabetes Paternal Aunt    Diabetes Paternal Uncle    Diabetes Paternal Grandmother    Diabetes Paternal Grandfather    Breast cancer Neg Hx     Social History Social History   Tobacco Use   Smoking status: Never   Smokeless tobacco: Never   Vaping Use   Vaping Use: Never used  Substance Use Topics   Alcohol use: Never   Drug use: Never     Allergies   Patient has no known allergies.   Review of Systems Review of Systems  HENT:  Positive for ear pain.   All other systems reviewed and are negative.   Physical Exam Triage Vital Signs ED Triage Vitals  Enc Vitals Group     BP 06/21/21 0858 138/83     Pulse Rate 06/21/21 0858 79     Resp 06/21/21 0858 18     Temp 06/21/21 0858 98.1 F (36.7 C)     Temp Source 06/21/21 0858 Oral     SpO2 06/21/21 0858 97 %     Weight --      Height --      Head Circumference --      Peak Flow --      Pain Score 06/21/21 0859 6     Pain Loc --      Pain Edu? --      Excl. in GC? --    No data found.  Updated Vital Signs BP 138/83 (BP Location: Left Arm)   Pulse 79   Temp 98.1 F (36.7 C) (Oral)   Resp 18   SpO2 97%   Visual Acuity Right Eye Distance:   Left Eye Distance:   Bilateral Distance:    Right Eye Near:   Left Eye Near:    Bilateral Near:     Physical Exam HENT:     Left Ear: Tympanic membrane normal.     Ears:     Comments: Right ear canal erythematous, swollen  Neurological:     Mental Status: She is alert.  Psychiatric:        Mood and Affect: Mood normal.     UC Treatments / Results  Labs (all labs ordered are listed, but only abnormal results are displayed) Labs Reviewed - No data to display  EKG   Radiology No results found.  Procedures Procedures (including critical care time)  Medications Ordered in UC Medications - No data to display  Initial Impression / Assessment and Plan / UC Course  I have reviewed the triage vital signs and the nursing notes.  Pertinent labs & imaging results that were available during my care of the patient were reviewed by me and considered in my medical decision making (see chart for details).     MDM:   Final Clinical Impressions(s) / UC Diagnoses   Final diagnoses:  Otitis, externa,  infective, right     Discharge Instructions      Return if any problems    ED Prescriptions     Medication Sig Dispense Auth. Provider   NEOMYCIN-POLYMYXIN-HYDROCORTISONE (CORTISPORIN) 1 % SOLN OTIC solution Place 3 drops into the right ear every 6 (six)  hours. 10 mL Elson Areas, [place]      PDMP not reviewed this encounter.   Elson Areas, [place] 06/21/21 [phone]

## 2021-06-21 NOTE — Discharge Instructions (Addendum)
Return if any problems.

## 2021-06-26 ENCOUNTER — Telehealth: Payer: Self-pay | Admitting: Cardiology

## 2021-06-26 NOTE — Telephone Encounter (Signed)
Pt c/o medication issue:  1. Name of Medication: sacubitril-valsartan (ENTRESTO) 49-51 MG  2. How are you currently taking this medication (dosage and times per day)? 1 tablet by mouth twice a day  3. Are you having a reaction (difficulty breathing--STAT)? no  4. What is your medication issue? Patient called in wanting to speak with Hayley in regards to medication sacubitril-valsartan (ENTRESTO) 49-51 MG. She stated that she thought it was resolve but the pharmacy has gotten anything. I put in a new refill requested but patient is still wanting to speak with the nurse. She said its going on 3 weeks without th medication. Please advise

## 2021-06-26 NOTE — Telephone Encounter (Signed)
Duplicate

## 2021-06-26 NOTE — Telephone Encounter (Signed)
*  STAT* If patient is at the pharmacy, call can be transferred to refill team.   1. Which medications need to be refilled? (please list name of each medication and dose if known) sacubitril-valsartan (ENTRESTO) 49-51 MG  2. Which pharmacy/location (including street and city if local pharmacy) is medication to be sent to? [id number] - [place], [place] - [address]  3. Do they need a 30 day or 90 day supply? 90  Patient called to say that she been out of medication now for 3 weeks. She stated she call prior to and the person she spoke to told her that she would take care of it but they didn't. Please advise

## 2021-06-26 NOTE — Telephone Encounter (Signed)
Returned the call to the patient. She stated that she has been out of the [facility] for two weeks now and her pharmacy won't fill it.   Reached out to her pharmacy. They stated that they will need a new PA for the increased dosage.   The patient has been made aware that a two week supply of samples will be available for her at the front:  Medication Samples have been provided to the patient.  Drug name: Sherryll Burger       Strength: 49-51 mg        Qty: 1 bottle   LOT: [id number]  Exp.Date: 7/24

## 2021-06-26 NOTE — Telephone Encounter (Signed)
PA submitted for Entresto 49/51  Key: [id number]

## 2021-07-07 NOTE — Telephone Encounter (Addendum)
Called [employer] to follow up on PA submitted 7/28.  No fax received with approval/denial.  Entresto 49/51 mg approved 06/26/21-06/26/22 Approval fax requested and received.  Approval letter faxed to [facility]

## 2021-07-16 ENCOUNTER — Other Ambulatory Visit: Payer: Self-pay | Admitting: Cardiology

## 2021-07-16 DIAGNOSIS — I5041 Acute combined systolic (congestive) and diastolic (congestive) heart failure: Secondary | ICD-10-CM

## 2021-07-16 NOTE — Telephone Encounter (Signed)
Rx(s) sent to pharmacy electronically.  

## 2021-07-21 ENCOUNTER — Ambulatory Visit: Payer: Medicaid Other | Admitting: Nurse Practitioner

## 2021-07-22 ENCOUNTER — Ambulatory Visit (HOSPITAL_BASED_OUTPATIENT_CLINIC_OR_DEPARTMENT_OTHER): Payer: Medicaid Other | Attending: Cardiology | Admitting: Cardiovascular Disease

## 2021-08-03 ENCOUNTER — Other Ambulatory Visit: Payer: Self-pay | Admitting: Cardiology

## 2021-08-03 DIAGNOSIS — I5041 Acute combined systolic (congestive) and diastolic (congestive) heart failure: Secondary | ICD-10-CM

## 2021-08-29 ENCOUNTER — Ambulatory Visit: Payer: Medicaid Other | Admitting: Cardiology

## 2021-08-29 NOTE — Progress Notes (Deleted)
Cardiology Office Note:    Date:  08/29/2021   ID:  Connie Luna, DOB 03-06-1976, MRN [id number]  PCP:  [name], MD  Cardiologist:  [name], MD  Electrophysiologist:  None   Referring MD: [name], MD   No chief complaint on file.   History of Present Illness:    Connie Luna is a 45 y.o. female with a hx of hypertension, hyperlipidemia, diabetes who presents for follow-up.  She was initially seen as a ED follow-up for chest pain on 02/28/2020.  Recent Covid infection in January.  Reports she woke up with chest pain around 3 AM on 3/30.  Described as sharp stabbing pain on left side of chest.  Lasted a few seconds and resolved, would occur repeatedly every few minutes for about 30 minutes.  She also describes a sensation that something is sitting in her chest, describes as feeling like food being stuck, it has been continuous for 1 week.  Denies any shortness of breath.  She typically walks for 20 minutes 2-3 times per week, denies any exertional chest pain or dyspnea.  She has been on losartan for hypertension, but was only taking about once per week.  No smoking history.  No history of heart disease in her immediate family.  Work-up in the ED included D-dimer negative, troponin 24 -> 22.  Lexiscan Myoview on 03/19/2020 showed small fixed defect at the apex, no evidence of ischemia (though no poor quality study with low cardiac counts compared to extracardiac activity), EF 31%.  TTE on 04/02/2020 showed basal inferior/inferoseptal akinesis with LVEF 35 to 40%, grade 2 diastolic dysfunction, moderate LV dilatation, no significant valvular disease, IVC small/collapsible.  RHC/LHC on 04/24/2020 showed normal coronary arteries, RA 7, RV 46/7, PA 36/15/26, PWCP 22, LVEDP 32, CI 4.5.  She was started on Lasix 40 mg daily and spironolactone 12.5 mg daily.  Cardiac MRI on 08/13/2020 showed LVEF 37%, normal RV function, mid wall LGE.  Echo on 03/11/2021 showed LVEF stable at 35 to  40%.  Since last clinic visit,  Today, her husband joins her by phone. Since last clinic visit, she has been feeling good overall. Occasionally, if she sits for a long time while working at home, she becomes lightheaded when she stands up. She has not needed to take Lasix. Her hemoglobin A1C is 6.7. Also, she endorses snoring and was tested for sleep apnea 10+ years ago which was negative. For exercise, she usually walks but is inconsistent. Lately she is walking 2-3 days a week and plans to increase this. She denies any chest pain, shortness of breath, palpitations, or exertional symptoms. No headaches, or syncope to report. Also has no lower extremity edema, orthopnea or PND.    Wt Readings from Last 3 Encounters:  05/06/21 257 lb (116.6 kg)  03/18/21 256 lb 12.8 oz (116.5 kg)  12/18/20 254 lb (115.2 kg)    BP Readings from Last 3 Encounters:  06/21/21 138/83  05/06/21 126/78  03/18/21 (!) 148/87     Past Medical History:  Diagnosis Date   Diabetes mellitus without complication (HCC)    Hypertension     Past Surgical History:  Procedure Laterality Date   RIGHT/LEFT HEART CATH AND CORONARY ANGIOGRAPHY N/A 04/24/2020   Procedure: RIGHT/LEFT HEART CATH AND CORONARY ANGIOGRAPHY;  Surgeon: [name], MD;  Location: [facility] INVASIVE CV LAB;  Service: Cardiovascular;  Laterality: N/A;    Current Medications: No outpatient medications have been marked as taking for the 08/29/21 encounter (  Appointment) with [name], MD.   Current Facility-Administered Medications for the 08/29/21 encounter (Appointment) with [name], MD  Medication   sodium chloride flush (NS) 0.9 % injection 3 mL     Allergies:   Patient has no known allergies.   Social History   Socioeconomic History   Marital status: Married    Spouse name: Not on file   Number of children: Not on file   Years of education: Not on file   Highest education level: Not on file  Occupational  History   Not on file  Tobacco Use   Smoking status: Never   Smokeless tobacco: Never  Vaping Use   Vaping Use: Never used  Substance and Sexual Activity   Alcohol use: Never   Drug use: Never   Sexual activity: Never  Other Topics Concern   Not on file  Social History Narrative   Not on file   Social Determinants of Health   Financial Resource Strain: Not on file  Food Insecurity: Not on file  Transportation Needs: Not on file  Physical Activity: Not on file  Stress: Not on file  Social Connections: Not on file     Family History: The patient's family history includes Diabetes in her father, paternal aunt, paternal grandfather, paternal grandmother, and paternal uncle; Hyperlipidemia in her mother; Hypertension in her mother. There is no history of Breast cancer.  ROS:   Please see the history of present illness.    (+) Lightheadedness All other systems reviewed and are negative.  EKGs/Labs/Other Studies Reviewed:    The following studies were reviewed today:  Echo 03/11/2021: 1. Left ventricular ejection fraction, by estimation, is 35 to 40%. The  left ventricle has moderately decreased function. The left ventricle  demonstrates global hypokinesis. There is mild left ventricular  hypertrophy. Left ventricular diastolic  parameters are consistent with Grade I diastolic dysfunction (impaired  relaxation).   2. Right ventricular systolic function is normal. The right ventricular  size is normal.   3. The mitral valve is grossly normal. Trivial mitral valve  regurgitation.   4. The aortic valve is tricuspid. Aortic valve regurgitation is not  visualized.   5. The inferior vena cava is normal in size with greater than 50%  respiratory variability, suggesting right atrial pressure of 3 mmHg.   Comparison(s): No significant change from prior study. 04/02/2020: LVEF  35-40%.   MR Cardiac Morphology 08/13/2020: IMPRESSION: 1. Normal left ventricular size by indexed  volume. Moderately reduced systolic function. LVEF 37%.   2.  Normal right ventricular chamber size and systolic function.   3. Nonspecific delayed myocardial enhancement. Midmyocardial stripe of delay enhancement circumferentially. ECV 36%. No myocardial edema.  RHC/LHC 04/24/2020: Hemodynamic findings consistent with MILD PULMONARY HYPERTENSION-PULMONARY VENOUS HYPERTENSION. LV end diastolic pressure is moderate-severely elevated. 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 daily Lasix as well as 12.5 mg spironolactone. Increase Toprol to 50 mg daily Anticipate converting from losartan to Entresto. Follow-up with Dr. Bjorn Pippin   Echo 04/02/2020: 1. Akinesis of the basal inferior and inferoseptal walls; overall  moderate LV dysfunction; grade 2 diastolic dysfunction; moderate LVE; mild  LAE.   2.  Left ventricular ejection fraction, by estimation, is 35 to 40%. The  left ventricle has moderately decreased function. The left ventricle  demonstrates regional wall motion abnormalities (see scoring  diagram/findings for description). The left  ventricular internal cavity size was moderately dilated. Left ventricular  diastolic parameters are consistent with Grade II diastolic dysfunction  (pseudonormalization).   3. Right ventricular systolic function is normal. The right ventricular  size is normal.   4. Left atrial size was mildly dilated.   5. The mitral valve is normal in structure. Trivial mitral valve  regurgitation. No evidence of mitral  stenosis.   6. The aortic valve is tricuspid. Aortic valve regurgitation is not  visualized. No aortic stenosis is present.   7. The inferior vena cava is normal in size with greater than 50%  respiratory variability, suggesting right atrial pressure of 3 mmHg.   Lexiscan Myoview 03/19/2020: Nuclear stress EF: 31%. There was no ST segment deviation noted during stress. This is a high risk study. The left ventricular ejection fraction is moderately decreased (30-44%). No prior study for comparison.   Difficult images, with low cardiac counts visually compared to extracardiac activity. There is a small, mild, fixed defect at the apex, consider scar vs. Apical thinning. No evidence of ischemia. EF calculated as low with global hypokinesis, but recommend echocardiogram to confirm given limitations of this study.  EKG:   05/06/2021: NSR, rate 69, poor R wave progression, nonspecific T wave flattening 12/11/2020: EKG was not ordered.   09/18/2020: NSR, rate 75, poor R wave progression, nonspecific T wave flattening 05/06/2020: NSR, rate 95, no ST abnormalities  Recent Labs: 12/04/2020: TSH 0.613 12/11/2020: Magnesium 1.6 05/06/2021: BUN 7; Creatinine, Ser 0.70; Potassium 4.8; Sodium 141  Recent Lipid Panel    Component Value Date/Time   CHOL 194 12/04/2020 0933   TRIG 88 12/04/2020 0933   HDL 55 12/04/2020 0933   CHOLHDL 3.5 12/04/2020 0933   LDLCALC 123 (H) 12/04/2020 0933    Physical Exam:    VS:  There were no vitals taken for this visit.    Wt Readings from Last 3 Encounters:  05/06/21 257 lb (116.6 kg)  03/18/21 256 lb 12.8 oz (116.5 kg)  12/18/20 254 lb (115.2 kg)     GEN:  in no acute distress HEENT: Normal NECK: No JVD LYMPHATICS: No lymphadenopathy CARDIAC:RRR, no murmurs, rubs, gallops RESPIRATORY:  Clear to auscultation without rales, wheezing or rhonchi  ABDOMEN: Soft, non-tender, non-distended MUSCULOSKELETAL:  No edema; No deformity  SKIN: Warm and dry NEUROLOGIC:   Alert and oriented x 3 PSYCHIATRIC:  Normal affect   ASSESSMENT:    No diagnosis found.  PLAN:    Chronic combined systolic and diastolic heart failure:  Lexiscan Myoview on 03/19/2020 showed small fixed defect at the apex, no evidence of ischemia but was poor quality study with low cardiac counts compared to extracardiac activity.  EF 31%.  TTE on 04/02/2020 showed basal inferior/inferoseptal akinesis with LVEF 35 to 40%, grade 2 diastolic dysfunction, moderate LV dilatation, no significant valvular disease, IVC small/collapsible.  RHC/LHC on 04/24/2020 showed normal coronary arteries, RA 7, RV 46/7, PA 36/15/26, PWCP 22, LVEDP 32, CI 4.5.  Cardiac MRI on 08/13/2020 showed LVEF 37%, normal RV function, mid wall LGE.  Echo on 03/11/2021 showed LVEF stable at 35 to 40%. -Continue Entresto 49-51 mg twice daily -Continue toprol XL 200 mg daily -Continue spironolactone 12.5 mg daily -Continue Farxiga 10 mg daily -Continue as needed Lasix.  Appears euvolemic.  Advised  to monitor daily weights and take Lasix if gains more than 3 pounds in 1 day or 5 pounds in one week   Hypertension: On Entresto, Aldactone, Toprol-XL.  Appears well controlled   Type 2 diabetes: On Lantus and Farxiga.  Follows with endocrinology, marked improvement in hemoglobin A1c from 10.1 to 6.7% on 03/18/2021   Hyperlipidemia: Continue atorvastatin 40 mg daily.  LDL 123 on 12/04/2020   Anemia: Hemoglobin 10.4 on 04/19/2020.   Iron studies consistent with iron deficiency anemia, started on iron supplementation.  Reports heavy menstrual bleeding, otherwise denies any bleeding issues  Snoring: Check sleep study  Obesity: Body mass index is There is no height or weight on file to calculate BMI.  Refer to healthy weight and wellness   RTC in***  Medication Adjustments/Labs and Tests Ordered: Current medicines are reviewed at length with the patient today.  Concerns regarding medicines are outlined above.  No orders of the defined  types were placed in this encounter.  No orders of the defined types were placed in this encounter.   There are no Patient Instructions on file for this visit.   I,Mathew Stumpf,acting as a Neurosurgeon for [name], MD.,have documented all relevant documentation on the behalf of [name], MD,as directed by  [name], MD while in the presence of [name], MD.  I, [name], MD, have reviewed all documentation for this visit. The documentation on 08/29/21 for the exam, diagnosis, procedures, and orders are all accurate and complete.   Signed, [name], MD  08/29/2021 6:39 AM    [facility]

## 2021-10-07 ENCOUNTER — Encounter (INDEPENDENT_AMBULATORY_CARE_PROVIDER_SITE_OTHER): Payer: Self-pay

## 2021-10-28 ENCOUNTER — Other Ambulatory Visit: Payer: Self-pay | Admitting: Cardiology

## 2021-10-31 ENCOUNTER — Other Ambulatory Visit: Payer: Self-pay | Admitting: Nurse Practitioner

## 2021-11-10 ENCOUNTER — Telehealth: Payer: Self-pay

## 2021-11-10 NOTE — Telephone Encounter (Signed)
Letter has been sent to patient informing them that their sleep study has expired. Patient will need to call and schedule an office visit to re-evaluate the need for a sleep study.    

## 2021-11-30 ENCOUNTER — Other Ambulatory Visit: Payer: Self-pay | Admitting: Nurse Practitioner

## 2022-01-30 ENCOUNTER — Other Ambulatory Visit: Payer: Self-pay | Admitting: Cardiology

## 2022-03-10 ENCOUNTER — Other Ambulatory Visit: Payer: Self-pay | Admitting: Nurse Practitioner

## 2022-04-23 ENCOUNTER — Other Ambulatory Visit: Payer: Self-pay

## 2022-04-23 DIAGNOSIS — I420 Dilated cardiomyopathy: Secondary | ICD-10-CM

## 2022-04-23 MED ORDER — METOPROLOL SUCCINATE ER 200 MG PO TB24: 200.0000 mg | ORAL_TABLET | Freq: Every day | ORAL | 0 refills | Status: DC

## 2022-05-01 ENCOUNTER — Other Ambulatory Visit: Payer: Self-pay | Admitting: Cardiology

## 2022-07-08 ENCOUNTER — Encounter (INDEPENDENT_AMBULATORY_CARE_PROVIDER_SITE_OTHER): Payer: Self-pay

## 2022-07-29 ENCOUNTER — Other Ambulatory Visit: Payer: Self-pay | Admitting: Cardiology

## 2022-07-29 DIAGNOSIS — I420 Dilated cardiomyopathy: Secondary | ICD-10-CM

## 2022-11-06 ENCOUNTER — Other Ambulatory Visit: Payer: Self-pay | Admitting: Cardiology

## 2022-11-06 DIAGNOSIS — I5041 Acute combined systolic (congestive) and diastolic (congestive) heart failure: Secondary | ICD-10-CM

## 2022-11-06 DIAGNOSIS — I420 Dilated cardiomyopathy: Secondary | ICD-10-CM

## 2023-02-01 ENCOUNTER — Other Ambulatory Visit: Payer: Self-pay | Admitting: Cardiology

## 2023-02-01 DIAGNOSIS — I420 Dilated cardiomyopathy: Secondary | ICD-10-CM

## 2023-02-01 DIAGNOSIS — I5041 Acute combined systolic (congestive) and diastolic (congestive) heart failure: Secondary | ICD-10-CM

## 2023-02-21 ENCOUNTER — Other Ambulatory Visit: Payer: Self-pay | Admitting: Cardiology

## 2023-03-11 ENCOUNTER — Other Ambulatory Visit: Payer: Self-pay | Admitting: Cardiology

## 2023-03-11 DIAGNOSIS — I420 Dilated cardiomyopathy: Secondary | ICD-10-CM

## 2023-03-25 ENCOUNTER — Other Ambulatory Visit: Payer: Self-pay | Admitting: Cardiology

## 2023-03-25 DIAGNOSIS — I420 Dilated cardiomyopathy: Secondary | ICD-10-CM
